# Patient Record
Sex: Female | Born: 1980 | Race: White | Hispanic: No | Marital: Married | Smoking: Current every day smoker
Health system: Southern US, Community
[De-identification: ages and names within clinical notes are randomized; demographics above are authoritative.]

## PROBLEM LIST (undated history)

## (undated) ENCOUNTER — Ambulatory Visit: Admission: EM | Payer: BLUE CROSS/BLUE SHIELD | Source: Home / Self Care

## (undated) DIAGNOSIS — B029 Zoster without complications: Secondary | ICD-10-CM

## (undated) DIAGNOSIS — J45909 Unspecified asthma, uncomplicated: Secondary | ICD-10-CM

## (undated) DIAGNOSIS — F17200 Nicotine dependence, unspecified, uncomplicated: Secondary | ICD-10-CM

## (undated) HISTORY — PX: DILATION AND CURETTAGE OF UTERUS: SHX78

---

## 2005-04-15 ENCOUNTER — Emergency Department: Payer: Self-pay | Admitting: Emergency Medicine

## 2005-11-07 ENCOUNTER — Observation Stay: Payer: Self-pay

## 2007-05-15 ENCOUNTER — Emergency Department: Payer: Self-pay | Admitting: Emergency Medicine

## 2007-09-03 ENCOUNTER — Emergency Department: Payer: Self-pay | Admitting: Emergency Medicine

## 2007-12-04 ENCOUNTER — Emergency Department: Payer: Self-pay | Admitting: Emergency Medicine

## 2008-01-20 ENCOUNTER — Emergency Department: Payer: Self-pay | Admitting: Emergency Medicine

## 2008-04-24 ENCOUNTER — Emergency Department: Payer: Self-pay | Admitting: Emergency Medicine

## 2008-05-26 ENCOUNTER — Emergency Department: Payer: Self-pay | Admitting: Emergency Medicine

## 2008-07-31 ENCOUNTER — Ambulatory Visit: Payer: Self-pay

## 2009-03-20 ENCOUNTER — Emergency Department: Payer: Self-pay | Admitting: Emergency Medicine

## 2009-06-05 ENCOUNTER — Emergency Department: Payer: Self-pay | Admitting: Internal Medicine

## 2009-06-07 ENCOUNTER — Ambulatory Visit: Payer: Self-pay | Admitting: Family Medicine

## 2009-08-10 ENCOUNTER — Emergency Department: Payer: Self-pay | Admitting: Internal Medicine

## 2009-09-20 ENCOUNTER — Emergency Department: Payer: Self-pay | Admitting: Emergency Medicine

## 2010-02-20 ENCOUNTER — Ambulatory Visit: Payer: Self-pay | Admitting: Family Medicine

## 2010-05-11 ENCOUNTER — Emergency Department: Payer: Self-pay | Admitting: Internal Medicine

## 2010-06-21 ENCOUNTER — Emergency Department: Payer: Self-pay | Admitting: Unknown Physician Specialty

## 2010-09-29 ENCOUNTER — Emergency Department: Payer: Self-pay | Admitting: Emergency Medicine

## 2011-04-08 ENCOUNTER — Emergency Department: Payer: Self-pay | Admitting: Internal Medicine

## 2014-07-12 ENCOUNTER — Emergency Department: Payer: Self-pay | Admitting: Emergency Medicine

## 2014-11-19 ENCOUNTER — Emergency Department: Payer: Self-pay | Admitting: Emergency Medicine

## 2015-03-31 ENCOUNTER — Emergency Department
Admit: 2015-03-31 | Disposition: A | Discharge status: Home / Self Care | Payer: Self-pay | Admitting: Physician Assistant

## 2015-04-24 ENCOUNTER — Emergency Department: Payer: Self-pay

## 2015-04-24 ENCOUNTER — Encounter: Payer: Self-pay | Admitting: Urgent Care

## 2015-04-24 ENCOUNTER — Emergency Department
Admission: EM | Admit: 2015-04-24 | Discharge: 2015-04-24 | Disposition: A | Discharge status: Home / Self Care | Payer: Self-pay | Attending: Emergency Medicine | Admitting: Emergency Medicine

## 2015-04-24 DIAGNOSIS — M216X1 Other acquired deformities of right foot: Secondary | ICD-10-CM | POA: Insufficient documentation

## 2015-04-24 DIAGNOSIS — Z880 Allergy status to penicillin: Secondary | ICD-10-CM | POA: Insufficient documentation

## 2015-04-24 DIAGNOSIS — Z720 Tobacco use: Secondary | ICD-10-CM | POA: Insufficient documentation

## 2015-04-24 DIAGNOSIS — M79671 Pain in right foot: Secondary | ICD-10-CM

## 2015-04-24 DIAGNOSIS — M21961 Unspecified acquired deformity of right lower leg: Secondary | ICD-10-CM

## 2015-04-24 HISTORY — DX: Unspecified asthma, uncomplicated: J45.909

## 2015-04-24 MED ORDER — HYDROCODONE-ACETAMINOPHEN 5-325 MG PO TABS: 1.0000 | ORAL_TABLET | ORAL | Status: DC | PRN

## 2015-04-24 MED ORDER — IBUPROFEN 800 MG PO TABS: 800.0000 mg | ORAL_TABLET | Freq: Three times a day (TID) | ORAL | Status: DC | PRN

## 2015-04-24 MED ORDER — HYDROCODONE-ACETAMINOPHEN 5-325 MG PO TABS
ORAL_TABLET | ORAL | Status: AC
  Administered 2015-04-24: 2 via ORAL
  Filled 2015-04-24: qty 2

## 2015-04-24 MED ORDER — HYDROCODONE-ACETAMINOPHEN 5-325 MG PO TABS
2.0000 | ORAL_TABLET | Freq: Once | ORAL | Status: AC
  Administered 2015-04-24: 2 via ORAL
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
## 2015-06-25 ENCOUNTER — Emergency Department
Admission: EM | Admit: 2015-06-25 | Discharge: 2015-06-25 | Disposition: A | Discharge status: Home / Self Care | Payer: No Typology Code available for payment source | Attending: Emergency Medicine | Admitting: Emergency Medicine

## 2015-06-25 ENCOUNTER — Encounter: Payer: Self-pay | Admitting: Emergency Medicine

## 2015-06-25 DIAGNOSIS — Y9389 Activity, other specified: Secondary | ICD-10-CM | POA: Insufficient documentation

## 2015-06-25 DIAGNOSIS — Z880 Allergy status to penicillin: Secondary | ICD-10-CM | POA: Diagnosis not present

## 2015-06-25 DIAGNOSIS — S239XXA Sprain of unspecified parts of thorax, initial encounter: Secondary | ICD-10-CM | POA: Insufficient documentation

## 2015-06-25 DIAGNOSIS — Y92410 Unspecified street and highway as the place of occurrence of the external cause: Secondary | ICD-10-CM | POA: Diagnosis not present

## 2015-06-25 DIAGNOSIS — Y998 Other external cause status: Secondary | ICD-10-CM | POA: Diagnosis not present

## 2015-06-25 DIAGNOSIS — S161XXA Strain of muscle, fascia and tendon at neck level, initial encounter: Secondary | ICD-10-CM | POA: Insufficient documentation

## 2015-06-25 DIAGNOSIS — S199XXA Unspecified injury of neck, initial encounter: Secondary | ICD-10-CM | POA: Diagnosis present

## 2015-06-25 DIAGNOSIS — Z720 Tobacco use: Secondary | ICD-10-CM | POA: Insufficient documentation

## 2015-06-25 MED ORDER — CYCLOBENZAPRINE HCL 10 MG PO TABS
5.0000 mg | ORAL_TABLET | Freq: Once | ORAL | Status: AC
  Administered 2015-06-25: 5 mg via ORAL
  Filled 2015-06-25: qty 1

## 2015-06-25 MED ORDER — CYCLOBENZAPRINE HCL 5 MG PO TABS: 5.0000 mg | ORAL_TABLET | Freq: Three times a day (TID) | ORAL | Status: DC | PRN

## 2015-06-25 MED ORDER — KETOROLAC TROMETHAMINE 10 MG PO TABS: 10.0000 mg | ORAL_TABLET | Freq: Three times a day (TID) | ORAL | Status: DC

## 2015-06-25 MED ORDER — KETOROLAC TROMETHAMINE 10 MG PO TABS
20.0000 mg | ORAL_TABLET | Freq: Once | ORAL | Status: AC
  Administered 2015-06-25: 20 mg via ORAL
  Filled 2015-06-25: qty 2
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
## 2015-07-12 ENCOUNTER — Encounter: Payer: Self-pay | Admitting: Emergency Medicine

## 2015-07-12 ENCOUNTER — Emergency Department
Admission: EM | Admit: 2015-07-12 | Discharge: 2015-07-12 | Disposition: A | Discharge status: Home / Self Care | Payer: Self-pay | Attending: Emergency Medicine | Admitting: Emergency Medicine

## 2015-07-12 DIAGNOSIS — Z880 Allergy status to penicillin: Secondary | ICD-10-CM | POA: Insufficient documentation

## 2015-07-12 DIAGNOSIS — Z720 Tobacco use: Secondary | ICD-10-CM | POA: Insufficient documentation

## 2015-07-12 DIAGNOSIS — K047 Periapical abscess without sinus: Secondary | ICD-10-CM | POA: Insufficient documentation

## 2015-07-12 DIAGNOSIS — K029 Dental caries, unspecified: Secondary | ICD-10-CM | POA: Insufficient documentation

## 2015-07-12 MED ORDER — KETOROLAC TROMETHAMINE 10 MG PO TABS: 10.0000 mg | ORAL_TABLET | Freq: Four times a day (QID) | ORAL | Status: DC | PRN

## 2015-07-12 MED ORDER — CLINDAMYCIN HCL 150 MG PO CAPS: ORAL_CAPSULE | ORAL | Status: DC

## 2015-07-12 NOTE — ED Notes (Signed)
Left side of face swollen and pain to left eye

## 2015-07-12 NOTE — ED Notes (Signed)
C/o left facial swelling

## 2015-07-12 NOTE — ED Provider Notes (Signed)
[facility] Emergency Department Provider Note  ____________________________________________  Time seen:  12:56 PM  I have reviewed the triage vital signs and the nursing notes.   HISTORY  Chief Complaint Facial Swelling   HPI Leah Gates is a 34 y.o. female is here with complaint of left facial swelling. She states that she's had a tooth that has been broken off for some time and believes this is where the infection is coming from. She also had her husband put some dental paste they got over-the-counter at a drugstore into the remaining dental area. She has taken some ibuprofen with minimal relief. She denies any fever or chills. Currently her pain is 7 out of 10.   Past Medical History  Diagnosis Date  . Asthma     There are no active problems to display for this patient.   Past Surgical History  Procedure Laterality Date  . Dilation and curettage of uterus      Current Outpatient Rx  Name  Route  Sig  Dispense  Refill  . clindamycin (CLEOCIN) 150 MG capsule      2 caps every 6 hours until finished   56 capsule   0   . ketorolac (TORADOL) 10 MG tablet   Oral   Take 1 tablet (10 mg total) by mouth every 6 (six) hours as needed.   12 tablet   0     Allergies Penicillins  No family history on file.  Social History History  Substance Use Topics  . Smoking status: Current Every Day Smoker  . Smokeless tobacco: Not on file  . Alcohol Use: No    Review of Systems Constitutional: No fever/chills Eyes: No visual changes. ENT: No sore throat. Dental pain positive Cardiovascular: Denies chest pain. Respiratory: Denies shortness of breath. Gastrointestinal: No abdominal pain.  No nausea, no vomiting.  Musculoskeletal: Negative for back pain. Skin: Negative for rash. Neurological: Negative for headaches  10-point ROS otherwise negative.  ____________________________________________   PHYSICAL EXAM:  VITAL SIGNS: ED Triage  Vitals  Enc Vitals Group     BP 07/12/15 1239 107/62 mmHg     Pulse Rate 07/12/15 1239 101     Resp 07/12/15 1239 18     Temp 07/12/15 1239 97.8 F (36.6 C)     Temp Source 07/12/15 1239 Oral     SpO2 07/12/15 1239 100 %     Weight 07/12/15 1239 125 lb (56.7 kg)     Height 07/12/15 1239 5\' 4"  (1.626 m)     Head Cir --      Peak Flow --      Pain Score 07/12/15 1241 7     Pain Loc --      Pain Edu? --      Excl. in GC? --     Constitutional: Alert and oriented. Well appearing and in no acute distress. Eyes: Conjunctivae are normal. PERRL. EOMI. Head: Atraumatic. Nose: No congestion/rhinnorhea. Mouth/Throat: Left cheek area with edema. Left upper molar with large caries present in the field with a dental paste. Tenderness on palpation of the gum surrounding the tooth. There is no localized abscess seen at this time. Mucous membranes are moist.  Oropharynx non-erythematous. Neck: No stridor.   Hematological/Lymphatic/Immunilogical: No cervical lymphadenopathy. Cardiovascular: Normal rate, regular rhythm. Grossly normal heart sounds.  Good peripheral circulation. Respiratory: Normal respiratory effort.  No retractions. Lungs CTAB. Gastrointestinal: Soft and nontender. No distention. Musculoskeletal: No lower extremity tenderness nor edema.  No joint effusions.  Neurologic:  Normal speech and language. No gross focal neurologic deficits are appreciated. No gait instability. Skin:  Skin is warm, dry and intact. No rash noted. Psychiatric: Mood and affect are normal. Speech and behavior are normal.  ____________________________________________   LABS (all labs ordered are listed, but only abnormal results are displayed)  Labs Reviewed - No data to display  PROCEDURES  Procedure(s) performed: None  Critical Care performed: No  ____________________________________________   INITIAL IMPRESSION / ASSESSMENT AND PLAN / ED COURSE  Pertinent labs & imaging results that were  available during my care of the patient were reviewed by me and considered in my medical decision making (see chart for details).  Patient was started on clindamycin due to her penicillin allergies. She is also given a prescription for Toradol 10 mg 4 times a day as needed for dental pain. She is also given a list of all the dental clinics in the area since she does not have any insurance. She is encouraged to call these clinics to arrange for dental care. ____________________________________________   FINAL CLINICAL IMPRESSION(S) / ED DIAGNOSES  Final diagnoses:  Dental abscess      Tommi Rumps, PA-C 07/12/15 1324  Emily Filbert, MD 07/12/15 [phone]

## 2015-07-12 NOTE — Discharge Instructions (Signed)
Dental Abscess °A dental abscess is a collection of infected fluid (pus) from a bacterial infection in the inner part of the tooth (pulp). It usually occurs at the end of the tooth's root.  °CAUSES  °· Severe tooth decay. °· Trauma to the tooth that allows bacteria to enter into the pulp, such as a broken or chipped tooth. °SYMPTOMS  °· Severe pain in and around the infected tooth. °· Swelling and redness around the abscessed tooth or in the mouth or face. °· Tenderness. °· Pus drainage. °· Bad breath. °· Bitter taste in the mouth. °· Difficulty swallowing. °· Difficulty opening the mouth. °· Nausea. °· Vomiting. °· Chills. °· Swollen neck glands. °DIAGNOSIS  °· A medical and dental history will be taken. °· An examination will be performed by tapping on the abscessed tooth. °· X-rays may be taken of the tooth to identify the abscess. °TREATMENT °The goal of treatment is to eliminate the infection. You may be prescribed antibiotic medicine to stop the infection from spreading. A root canal may be performed to save the tooth. If the tooth cannot be saved, it may be pulled (extracted) and the abscess may be drained.  °HOME CARE INSTRUCTIONS °· Only take over-the-counter or prescription medicines for pain, fever, or discomfort as directed by your caregiver. °· Rinse your mouth (gargle) often with salt water (¼ tsp salt in 8 oz [250 ml] of warm water) to relieve pain or swelling. °· Do not drive after taking pain medicine (narcotics). °· Do not apply heat to the outside of your face. °· Return to your dentist for further treatment as directed. °SEEK MEDICAL CARE IF: °· Your pain is not helped by medicine. °· Your pain is getting worse instead of better. °SEEK IMMEDIATE MEDICAL CARE IF: °· You have a fever or persistent symptoms for more than 2-3 days. °· You have a fever and your symptoms suddenly get worse. °· You have chills or a very bad headache. °· You have problems breathing or swallowing. °· You have trouble  opening your mouth. °· You have swelling in the neck or around the eye. °Document Released: 11/23/2005 Document Revised: 08/17/2012 Document Reviewed: 03/03/2011 °ExitCare® Patient Information ©2015 [employer]. This information is not intended to replace advice given to you by your health care provider. Make sure you discuss any questions you have with your health care provider. ° ° °OPTIONS FOR DENTAL FOLLOW UP CARE ° ° Department of Health and Human Services - Local Safety Net Dental Clinics °http://www.ncdhhs.gov/dph/oralhealth/services/safetynetclinics.htm °  °[facility] (336-562-3123) ° °[facility] [place] (919-933-9087) ° °[facility] (919-663-1744 ext [phone]) ° °[facility] (336-570-6415) ° °[facility] (919-968-2025) °This clinic caters to the indigent population and is on a lottery system. °Location: °[facility], [address], [place] °Clinic Hours: °Wednesdays from 6pm - 9pm, patients seen by a lottery system. °For dates, call or go to www.med.[facility].edu/[facility]/patients/Dental-[facility] °Services: °Cleanings, fillings and simple extractions. °Payment Options: °DENTAL WORK IS FREE OF CHARGE. Bring proof of income or support. °Best way to get seen: °Arrive at 5:15 pm - this is a lottery, NOT first come/first serve, so arriving earlier will not increase your chances of being seen. °  °  °[facility] °919-537-3737 °Select option 1 for emergencies °  °Location: °[facility], [address], [place] °Clinic Hours: °No walk-ins accepted - call the day before to schedule an appointment. °Check in times   are 9:30 am and 1:30 pm. °Services: °Simple extractions, temporary fillings, pulpectomy/pulp debridement, uncomplicated abscess drainage. °Payment Options: °PAYMENT IS DUE AT THE TIME OF SERVICE.  Fee is usually $100-200, additional surgical procedures (e.g. abscess drainage) may  be extra. °Cash, checks, Visa/MasterCard accepted.  Can file Medicaid if patient is covered for dental - patient should call case worker to check. °No discount for [facility] patients. °Best way to get seen: °MUST call the day before and get onto the schedule. Can usually be seen the next 1-2 days. No walk-ins accepted. °  °  °[facility] °[phone] °  °Location: °[facility], [address], [place] °Clinic Hours: °M, W, Th, F 8am or 1:30pm, Tues 9a or 1:30 - first come/first served. °Services: °Simple extractions, temporary fillings, uncomplicated abscess drainage.  You do not need to be an [place] resident. °Payment Options: °PAYMENT IS DUE AT THE TIME OF SERVICE. °Dental insurance, otherwise sliding scale - bring proof of income or support. °Depending on income and treatment needed, cost is usually $50-200. °Best way to get seen: °Arrive early as it is first come/first served. °  °  °[facility] °[phone] °  °Location: °[address] °Clinic Hours: °Mon-Thu 8a-5p °Services: °Most basic dental services including extractions and fillings. °Payment Options: °PAYMENT IS DUE AT THE TIME OF SERVICE. °Sliding scale, up to 50% off - bring proof if income or support. °Medicaid with dental option accepted. °Best way to get seen: °Call to schedule an appointment, can usually be seen within 2 weeks OR they will try to see walk-ins - show up at 8a or 2p (you may have to wait). °  °  °[facility] °919-245-2435 °[place] RESIDENTS ONLY °  °Location: °[facility], [address], [place], [place] [address] °Clinic Hours: By appointment only. °Monday - Thursday 8am-5pm, Friday 8am-12pm °Services: Cleanings, fillings, extractions. °Payment Options: °PAYMENT IS DUE AT THE TIME OF SERVICE. °Cash, Visa or MasterCard. Sliding scale - $30 minimum per service. °Best way to get seen: °Come in to  office, complete packet and make an appointment - need proof of income °or support monies for each household member and proof of [place] residence. °Usually takes about a month to get in. °  °  °[facility] °[phone] °  °Location: °[address] [place] °Clinic Hours: Walk-in Urgent Care Dental Services are offered Monday-Friday mornings only. °The numbers of emergencies accepted daily is limited to the number of °providers available. °Maximum 15 - Mondays, Wednesdays & Thursdays °Maximum 10 - Tuesdays & Fridays °Services: °You do not need to be a [place] resident to be seen for a dental emergency. °Emergencies are defined as pain, swelling, abnormal bleeding, or dental trauma. Walkins will receive x-rays if needed. °NOTE: Dental cleaning is not an emergency. °Payment Options: °PAYMENT IS DUE AT THE TIME OF SERVICE. °Minimum co-pay is $40.00 for uninsured patients. °Minimum co-pay is $3.00 for Medicaid with dental coverage. °Dental Insurance is accepted and must be presented at time of visit. °Medicare does not cover dental. °Forms of payment: Cash, credit card, checks. °Best way to get seen: °If not previously registered with the clinic, walk-in dental registration begins at 7:15 am and is on a first come/first serve basis. °If previously registered with the clinic, call to make an appointment. °  °  °[facility] °[phone] °[place] RESIDENTS ONLY °  °Location: °[address],   [place], [place] °Clinic Hours: °Mon-Thu 10a-2p °Services: Extractions only! °Payment Options: °FREE (donations accepted) - bring proof of income or support °Best way to get seen: °Call and schedule an appointment OR come at 8am on the 1st Monday of every month (except for holidays) when it is first come/first served. °  °  °[facility] °[phone] °  °Location: °[address], [place] °Clinic Hours: °Friday mornings °Services, Payment Options, Best way to get  seen: °Call for info °

## 2015-10-08 ENCOUNTER — Emergency Department
Admission: EM | Admit: 2015-10-08 | Discharge: 2015-10-08 | Disposition: A | Discharge status: Home / Self Care | Payer: Self-pay | Attending: Emergency Medicine | Admitting: Emergency Medicine

## 2015-10-08 ENCOUNTER — Encounter: Payer: Self-pay | Admitting: Emergency Medicine

## 2015-10-08 DIAGNOSIS — Z880 Allergy status to penicillin: Secondary | ICD-10-CM | POA: Insufficient documentation

## 2015-10-08 DIAGNOSIS — Z720 Tobacco use: Secondary | ICD-10-CM | POA: Insufficient documentation

## 2015-10-08 DIAGNOSIS — Z792 Long term (current) use of antibiotics: Secondary | ICD-10-CM | POA: Insufficient documentation

## 2015-10-08 DIAGNOSIS — B029 Zoster without complications: Secondary | ICD-10-CM | POA: Insufficient documentation

## 2015-10-08 MED ORDER — ACYCLOVIR 400 MG PO TABS: 400.0000 mg | ORAL_TABLET | Freq: Every day | ORAL | Status: AC

## 2015-10-08 MED ORDER — HYDROCODONE-ACETAMINOPHEN 5-325 MG PO TABS: 1.0000 | ORAL_TABLET | ORAL | Status: DC | PRN

## 2015-10-08 NOTE — ED Provider Notes (Signed)
[facility] Emergency Department Provider Note  ____________________________________________  Time seen: Approximately 8:46 AM  I have reviewed the triage vital signs and the nursing notes.   HISTORY  Chief Complaint Rash    HPI Leah Gates is a 34 y.o. female since for evaluation of shingles and blisters to the right shoulder. Past medical history the same. Complains of burning pain sensation.   Past Medical History  Diagnosis Date  . Asthma     There are no active problems to display for this patient.   Past Surgical History  Procedure Laterality Date  . Dilation and curettage of uterus      Current Outpatient Rx  Name  Route  Sig  Dispense  Refill  . acyclovir (ZOVIRAX) 400 MG tablet   Oral   Take 1 tablet (400 mg total) by mouth 5 (five) times daily.   50 tablet   0   . clindamycin (CLEOCIN) 150 MG capsule      2 caps every 6 hours until finished   56 capsule   0   . HYDROcodone-acetaminophen (NORCO) 5-325 MG tablet   Oral   Take 1-2 tablets by mouth every 4 (four) hours as needed for moderate pain.   15 tablet   0   . ketorolac (TORADOL) 10 MG tablet   Oral   Take 1 tablet (10 mg total) by mouth every 6 (six) hours as needed.   12 tablet   0     Allergies Penicillins  No family history on file.  Social History Social History  Substance Use Topics  . Smoking status: Current Every Day Smoker  . Smokeless tobacco: None  . Alcohol Use: No    Review of Systems Constitutional: No fever/chills Eyes: No visual changes. ENT: No sore throat. Cardiovascular: Denies chest pain. Respiratory: Denies shortness of breath. Gastrointestinal: No abdominal pain.  No nausea, no vomiting.  No diarrhea.  No constipation. Genitourinary: Negative for dysuria. Musculoskeletal: Negative for back pain. Skin: Positive for blistering rash right outer shoulder. Neurological: Negative for headaches, focal weakness or  numbness.  10-point ROS otherwise negative.  ____________________________________________   PHYSICAL EXAM:  VITAL SIGNS: ED Triage Vitals  Enc Vitals Group     BP 10/08/15 0827 113/67 mmHg     Pulse Rate 10/08/15 0827 73     Resp 10/08/15 0827 16     Temp 10/08/15 0827 97.6 F (36.4 C)     Temp Source 10/08/15 0827 Oral     SpO2 10/08/15 0827 100 %     Weight 10/08/15 0827 119 lb (53.978 kg)     Height 10/08/15 0827 5\' 2"  (1.575 m)     Head Cir --      Peak Flow --      Pain Score 10/08/15 0828 8     Pain Loc --      Pain Edu? --      Excl. in GC? --     Constitutional: Alert and oriented. Well appearing and in no acute distress. Cardiovascular: Normal rate, regular rhythm. Grossly normal heart sounds.  Good peripheral circulation. Respiratory: Normal respiratory effort.  No retractions. Lungs CTAB. Musculoskeletal: No lower extremity tenderness nor edema.  No joint effusions. Neurologic:  Normal speech and language. No gross focal neurologic deficits are appreciated. No gait instability. Skin:  Skin is warm, dry and intact. Positive rash with blisters intact. Right outer shoulder 2 cm nummular lesion area with approximately 6 blisters/vesicles Psychiatric: Mood and affect are  normal. Speech and behavior are normal.  ____________________________________________   LABS (all labs ordered are listed, but only abnormal results are displayed)  Labs Reviewed - No data to display ____________________________________________    PROCEDURES  Procedure(s) performed: None  Critical Care performed: No  ____________________________________________   INITIAL IMPRESSION / ASSESSMENT AND PLAN / ED COURSE  Pertinent labs & imaging results that were available during my care of the patient were reviewed by me and considered in my medical decision making (see chart for details).  Acute exacerbation of shingles. Rx given for acyclovir 400 mg 5 times daily, Vicodin as needed for  pain. ____________________________________________   FINAL CLINICAL IMPRESSION(S) / ED DIAGNOSES  Final diagnoses:  Shingles rash      Evangeline Dakin, PA-C 10/08/15 1610  Rockne Menghini, MD 10/08/15 1455

## 2015-10-08 NOTE — Discharge Instructions (Signed)

## 2015-10-08 NOTE — ED Notes (Signed)
Patient to ER with blistering rash to right shoulder. Patient has h/o shingles and states it looks and feels the same.

## 2015-10-08 NOTE — ED Notes (Signed)
Has small blisters on right shoulder

## 2015-10-26 ENCOUNTER — Encounter: Payer: Self-pay | Admitting: Emergency Medicine

## 2015-10-26 ENCOUNTER — Emergency Department
Admission: EM | Admit: 2015-10-26 | Discharge: 2015-10-26 | Disposition: A | Discharge status: Home / Self Care | Payer: Self-pay | Attending: Emergency Medicine | Admitting: Emergency Medicine

## 2015-10-26 ENCOUNTER — Emergency Department: Payer: Self-pay

## 2015-10-26 DIAGNOSIS — Y9289 Other specified places as the place of occurrence of the external cause: Secondary | ICD-10-CM | POA: Insufficient documentation

## 2015-10-26 DIAGNOSIS — S90121A Contusion of right lesser toe(s) without damage to nail, initial encounter: Secondary | ICD-10-CM

## 2015-10-26 DIAGNOSIS — F17210 Nicotine dependence, cigarettes, uncomplicated: Secondary | ICD-10-CM | POA: Insufficient documentation

## 2015-10-26 DIAGNOSIS — W500XXA Accidental hit or strike by another person, initial encounter: Secondary | ICD-10-CM | POA: Insufficient documentation

## 2015-10-26 DIAGNOSIS — S90211A Contusion of right great toe with damage to nail, initial encounter: Secondary | ICD-10-CM | POA: Insufficient documentation

## 2015-10-26 DIAGNOSIS — Y9389 Activity, other specified: Secondary | ICD-10-CM | POA: Insufficient documentation

## 2015-10-26 DIAGNOSIS — Z880 Allergy status to penicillin: Secondary | ICD-10-CM | POA: Insufficient documentation

## 2015-10-26 DIAGNOSIS — Y998 Other external cause status: Secondary | ICD-10-CM | POA: Insufficient documentation

## 2015-10-26 DIAGNOSIS — Z792 Long term (current) use of antibiotics: Secondary | ICD-10-CM | POA: Insufficient documentation

## 2015-10-26 MED ORDER — OXYCODONE-ACETAMINOPHEN 5-325 MG PO TABS: 1.0000 | ORAL_TABLET | ORAL | Status: DC | PRN

## 2015-10-26 NOTE — ED Provider Notes (Signed)
[facility] Emergency Department Provider Note  ____________________________________________  Time seen: Approximately 1:39 PM  I have reviewed the triage vital signs and the nursing notes.   HISTORY  Chief Complaint Toe Pain    HPI Carolann LittlerMonica M Puerta is a 34 y.o. female presents with right great toenail injury. Patient states that last night the toe nail was bent back and then she reinjured his toenail and this morning. Complains of increased pain concerned about fracture secondary to boyfriend reports stepping on her toe. The pain is 10 over 10 at this time.   Past Medical History  Diagnosis Date  . Asthma     There are no active problems to display for this patient.   Past Surgical History  Procedure Laterality Date  . Dilation and curettage of uterus      Current Outpatient Rx  Name  Route  Sig  Dispense  Refill  . clindamycin (CLEOCIN) 150 MG capsule      2 caps every 6 hours until finished   56 capsule   0   . HYDROcodone-acetaminophen (NORCO) 5-325 MG tablet   Oral   Take 1-2 tablets by mouth every 4 (four) hours as needed for moderate pain.   15 tablet   0   . ketorolac (TORADOL) 10 MG tablet   Oral   Take 1 tablet (10 mg total) by mouth every 6 (six) hours as needed.   12 tablet   0   . oxyCODONE-acetaminophen (ROXICET) 5-325 MG tablet   Oral   Take 1-2 tablets by mouth every 4 (four) hours as needed for severe pain.   15 tablet   0     Allergies Penicillins  No family history on file.  Social History Social History  Substance Use Topics  . Smoking status: Current Every Day Smoker -- 1.00 packs/day    Types: Cigarettes  . Smokeless tobacco: None  . Alcohol Use: No    Review of Systems Constitutional: No fever/chills Eyes: No visual changes. ENT: No sore throat. Cardiovascular: Denies chest pain. Respiratory: Denies shortness of breath. Gastrointestinal: No abdominal pain.  No nausea, no vomiting.  No  diarrhea.  No constipation. Genitourinary: Negative for dysuria. Musculoskeletal: Positive for right great toenail pain. Skin: Negative for rash. Neurological: Negative for headaches, focal weakness or numbness.  10-point ROS otherwise negative.  ____________________________________________   PHYSICAL EXAM:  VITAL SIGNS: ED Triage Vitals  Enc Vitals Group     BP 10/26/15 1232 106/55 mmHg     Pulse Rate 10/26/15 1232 100     Resp 10/26/15 1232 20     Temp 10/26/15 1232 98 F (36.7 C)     Temp Source 10/26/15 1232 Oral     SpO2 10/26/15 1232 97 %     Weight 10/26/15 1232 119 lb (53.978 kg)     Height 10/26/15 1232 5\' 4"  (1.626 m)     Head Cir --      Peak Flow --      Pain Score 10/26/15 1234 9     Pain Loc --      Pain Edu? --      Excl. in GC? --     Constitutional: Alert and oriented. Well appearing and in no acute distress. Eyes: Conjunctivae are normal. PERRL. EOMI. Head: Atraumatic. Nose: No congestion/rhinnorhea. Mouth/Throat: Mucous membranes are moist.  Oropharynx non-erythematous. Neck: No stridor.   Cardiovascular: Normal rate, regular rhythm. Grossly normal heart sounds.  Good peripheral circulation. Respiratory: Normal respiratory effort.  No retractions. Lungs CTAB. Musculoskeletal: No lower extremity tenderness nor edema.  No joint effusions. Neurologic:  Normal speech and language. No gross focal neurologic deficits are appreciated. No gait instability. Skin:  Skin is warm, dry and intact. No rash noted. Psychiatric: Mood and affect are normal. Speech and behavior are normal.  ____________________________________________   LABS (all labs ordered are listed, but only abnormal results are displayed)  Labs Reviewed - No data to display ____________________________________________   RADIOLOGY  No acute fracture dislocation. ____________________________________________   PROCEDURES  Procedure(s) performed: None  Critical Care performed:  No  ____________________________________________   INITIAL IMPRESSION / ASSESSMENT AND PLAN / ED COURSE  Pertinent labs & imaging results that were available during my care of the patient were reviewed by me and considered in my medical decision making (see chart for details).  Acute right no great toe contusion. Rx given for Percocet 5/325 as needed for pain patient follow-up PCP or return here with any worsening symptomology. ____________________________________________   FINAL CLINICAL IMPRESSION(S) / ED DIAGNOSES  Final diagnoses:  Contusion of toe of right foot, initial encounter      Evangeline Dakin, PA-C 10/26/15 1507  Myrna Blazer, MD 10/26/15 [phone]

## 2015-10-26 NOTE — Discharge Instructions (Signed)
Subungual Hematoma A subungual hematoma is a pocket of blood that collects under the fingernail or toenail. The pressure created by the blood under the nail can cause pain. CAUSES  A subungual hematoma occurs when an injury to the finger or toe causes a blood vessel beneath the nail to break. The injury can occur from a direct blow such as slamming a finger in a door. It can also occur from a repeated injury such as pressure on the foot in a shoe while running. A subungual hematoma is sometimes called runner's toe or tennis toe. SYMPTOMS   Blue or dark blue skin under the nail.  Pain or throbbing in the injured area. DIAGNOSIS  Your caregiver can determine whether you have a subungual hematoma based on your history and a physical exam. If your caregiver thinks you might have a broken (fractured) bone, X-rays may be taken. TREATMENT  Hematomas usually go away on their own over time. Your caregiver may make a hole in the nail to drain the blood. Draining the blood is painless and usually provides significant relief from pain and throbbing. The nail usually grows back normally after this procedure. In some cases, the nail may need to be removed. This is done if there is a cut under the nail that requires stitches (sutures). HOME CARE INSTRUCTIONS   Put ice on the injured area.  Put ice in a plastic bag.  Place a towel between your skin and the bag.  Leave the ice on for 15-20 minutes, 03-04 times a day for the first 1 to 2 days.  Elevate the injured area to help decrease pain and swelling.  If you were given a bandage, wear it for as long as directed by your caregiver.  If part of your nail falls off, trim the remaining nail gently. This prevents the nail from catching on something and causing further injury.  Only take over-the-counter or prescription medicines for pain, discomfort, or fever as directed by your caregiver. SEEK IMMEDIATE MEDICAL CARE IF:   You have redness or swelling  around the nail.  You have yellowish-white fluid (pus) coming from the nail.  Your pain is not controlled with medicine.  You have a fever. MAKE SURE YOU:  Understand these instructions.  Will watch your condition.  Will get help right away if you are not doing well or get worse.   This information is not intended to replace advice given to you by your health care provider. Make sure you discuss any questions you have with your health care provider.   Document Released: 11/20/2000 Document Revised: 02/15/2012 Document Reviewed: 04/10/2015 Elsevier Interactive Patient Education 2016 [employer]  Contusion A contusion is a deep bruise. Contusions are the result of a blunt injury to tissues and muscle fibers under the skin. The injury causes bleeding under the skin. The skin overlying the contusion may turn blue, purple, or yellow. Minor injuries will give you a painless contusion, but more severe contusions may stay painful and swollen for a few weeks.  CAUSES  This condition is usually caused by a blow, trauma, or direct force to an area of the body. SYMPTOMS  Symptoms of this condition include:  Swelling of the injured area.  Pain and tenderness in the injured area.  Discoloration. The area may have redness and then turn blue, purple, or yellow. DIAGNOSIS  This condition is diagnosed based on a physical exam and medical history. An X-ray, CT scan, or MRI may be needed to determine  if there are any associated injuries, such as broken bones (fractures). °TREATMENT  °Specific treatment for this condition depends on what area of the body was injured. In general, the best treatment for a contusion is resting, icing, applying pressure to (compression), and elevating the injured area. This is often called the RICE strategy. Over-the-counter anti-inflammatory medicines may also be recommended for pain control.  °HOME CARE INSTRUCTIONS  °· Rest the injured area. °· If directed, apply ice  to the injured area: °¨ Put ice in a plastic bag. °¨ Place a towel between your skin and the bag. °¨ Leave the ice on for 20 minutes, 2-3 times per day. °· If directed, apply light compression to the injured area using an elastic bandage. Make sure the bandage is not wrapped too tightly. Remove and reapply the bandage as directed by your health care provider. °· If possible, raise (elevate) the injured area above the level of your heart while you are sitting or lying down. °· Take over-the-counter and prescription medicines only as told by your health care provider. °SEEK MEDICAL CARE IF: °· Your symptoms do not improve after several days of treatment. °· Your symptoms get worse. °· You have difficulty moving the injured area. °SEEK IMMEDIATE MEDICAL CARE IF:  °· You have severe pain. °· You have numbness in a hand or foot. °· Your hand or foot turns pale or cold. °  °This information is not intended to replace advice given to you by your health care provider. Make sure you discuss any questions you have with your health care provider. °  °Document Released: 09/02/2005 Document Revised: 08/14/2015 Document Reviewed: 04/10/2015 °Elsevier Interactive Patient Education ©2016 Elsevier Inc. ° °

## 2015-10-26 NOTE — ED Notes (Signed)
Bent toenail back at initial injury

## 2016-01-11 ENCOUNTER — Encounter: Payer: Self-pay | Admitting: Emergency Medicine

## 2016-01-11 ENCOUNTER — Emergency Department
Admission: EM | Admit: 2016-01-11 | Discharge: 2016-01-11 | Disposition: A | Discharge status: Home / Self Care | Payer: No Typology Code available for payment source | Attending: Emergency Medicine | Admitting: Emergency Medicine

## 2016-01-11 ENCOUNTER — Emergency Department: Payer: No Typology Code available for payment source

## 2016-01-11 DIAGNOSIS — T148 Other injury of unspecified body region: Secondary | ICD-10-CM | POA: Insufficient documentation

## 2016-01-11 DIAGNOSIS — S80811A Abrasion, right lower leg, initial encounter: Secondary | ICD-10-CM | POA: Diagnosis not present

## 2016-01-11 DIAGNOSIS — Y998 Other external cause status: Secondary | ICD-10-CM | POA: Insufficient documentation

## 2016-01-11 DIAGNOSIS — Y92410 Unspecified street and highway as the place of occurrence of the external cause: Secondary | ICD-10-CM | POA: Insufficient documentation

## 2016-01-11 DIAGNOSIS — S80812A Abrasion, left lower leg, initial encounter: Secondary | ICD-10-CM | POA: Diagnosis not present

## 2016-01-11 DIAGNOSIS — T148XXA Other injury of unspecified body region, initial encounter: Secondary | ICD-10-CM

## 2016-01-11 DIAGNOSIS — S8992XA Unspecified injury of left lower leg, initial encounter: Secondary | ICD-10-CM | POA: Diagnosis present

## 2016-01-11 DIAGNOSIS — Z880 Allergy status to penicillin: Secondary | ICD-10-CM | POA: Insufficient documentation

## 2016-01-11 DIAGNOSIS — Y9389 Activity, other specified: Secondary | ICD-10-CM | POA: Diagnosis not present

## 2016-01-11 DIAGNOSIS — Z792 Long term (current) use of antibiotics: Secondary | ICD-10-CM | POA: Insufficient documentation

## 2016-01-11 DIAGNOSIS — F17210 Nicotine dependence, cigarettes, uncomplicated: Secondary | ICD-10-CM | POA: Insufficient documentation

## 2016-01-11 MED ORDER — IBUPROFEN 800 MG PO TABS: 800.0000 mg | ORAL_TABLET | Freq: Three times a day (TID) | ORAL | Status: DC | PRN

## 2016-01-11 MED ORDER — IBUPROFEN 800 MG PO TABS
800.0000 mg | ORAL_TABLET | Freq: Once | ORAL | Status: AC
  Administered 2016-01-11: 800 mg via ORAL

## 2016-01-11 MED ORDER — BACITRACIN ZINC 500 UNIT/GM EX OINT
TOPICAL_OINTMENT | Freq: Once | CUTANEOUS | Status: AC
  Administered 2016-01-11: 1 via TOPICAL

## 2016-01-11 MED ORDER — HYDROCODONE-ACETAMINOPHEN 5-325 MG PO TABS: 1.0000 | ORAL_TABLET | ORAL | Status: DC | PRN

## 2016-01-11 MED ORDER — OXYCODONE-ACETAMINOPHEN 5-325 MG PO TABS
1.0000 | ORAL_TABLET | Freq: Once | ORAL | Status: AC
  Administered 2016-01-11: 1 via ORAL
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
## 2016-01-11 NOTE — Discharge Instructions (Signed)
Continue pain medication as needed. Cleanse wounds daily and watch for signs of infection. He may use topical over-the-counter antibiotic ointment as needed. Return to emergency room for any worsening symptoms. Follow-up with your physician as needed.

## 2016-01-19 ENCOUNTER — Emergency Department
Admission: EM | Admit: 2016-01-19 | Discharge: 2016-01-19 | Disposition: A | Discharge status: Home / Self Care | Payer: No Typology Code available for payment source | Attending: Emergency Medicine | Admitting: Emergency Medicine

## 2016-01-19 DIAGNOSIS — Y9389 Activity, other specified: Secondary | ICD-10-CM | POA: Insufficient documentation

## 2016-01-19 DIAGNOSIS — S8992XA Unspecified injury of left lower leg, initial encounter: Secondary | ICD-10-CM | POA: Diagnosis present

## 2016-01-19 DIAGNOSIS — Y92410 Unspecified street and highway as the place of occurrence of the external cause: Secondary | ICD-10-CM | POA: Diagnosis not present

## 2016-01-19 DIAGNOSIS — F17210 Nicotine dependence, cigarettes, uncomplicated: Secondary | ICD-10-CM | POA: Insufficient documentation

## 2016-01-19 DIAGNOSIS — M79604 Pain in right leg: Secondary | ICD-10-CM

## 2016-01-19 DIAGNOSIS — Y998 Other external cause status: Secondary | ICD-10-CM | POA: Insufficient documentation

## 2016-01-19 DIAGNOSIS — S80812A Abrasion, left lower leg, initial encounter: Secondary | ICD-10-CM | POA: Diagnosis not present

## 2016-01-19 DIAGNOSIS — S80811A Abrasion, right lower leg, initial encounter: Secondary | ICD-10-CM | POA: Diagnosis not present

## 2016-01-19 DIAGNOSIS — M79605 Pain in left leg: Secondary | ICD-10-CM

## 2016-01-19 DIAGNOSIS — Z792 Long term (current) use of antibiotics: Secondary | ICD-10-CM | POA: Insufficient documentation

## 2016-01-19 DIAGNOSIS — Z880 Allergy status to penicillin: Secondary | ICD-10-CM | POA: Insufficient documentation

## 2016-01-19 LAB — CBC WITH DIFFERENTIAL/PLATELET
Basophils Absolute: 0 10*3/uL (ref 0–0.1)
Basophils Relative: 1 %
Eosinophils Absolute: 0.1 10*3/uL (ref 0–0.7)
Eosinophils Relative: 1 %
HCT: 35.6 % (ref 35.0–47.0)
HEMOGLOBIN: 12.2 g/dL (ref 12.0–16.0)
LYMPHS PCT: 33 %
Lymphs Abs: 2.2 10*3/uL (ref 1.0–3.6)
MCH: 32.4 pg (ref 26.0–34.0)
MCHC: 34.2 g/dL (ref 32.0–36.0)
MCV: 94.8 fL (ref 80.0–100.0)
Monocytes Absolute: 0.5 10*3/uL (ref 0.2–0.9)
Monocytes Relative: 7 %
Neutro Abs: 3.9 10*3/uL (ref 1.4–6.5)
Neutrophils Relative %: 58 %
Platelets: 321 10*3/uL (ref 150–440)
RBC: 3.75 MIL/uL — AB (ref 3.80–5.20)
RDW: 13.1 % (ref 11.5–14.5)
WBC: 6.8 10*3/uL (ref 3.6–11.0)

## 2016-01-19 MED ORDER — MUPIROCIN 2 % EX OINT: TOPICAL_OINTMENT | CUTANEOUS | Status: AC

## 2016-01-19 MED ORDER — ETODOLAC 400 MG PO TABS: 400.0000 mg | ORAL_TABLET | Freq: Two times a day (BID) | ORAL | Status: DC

## 2016-01-19 NOTE — Discharge Instructions (Signed)
Abrasion An abrasion is a cut or scrape on the surface of your skin. An abrasion does not go through all of the layers of your skin. It is important to take good care of your abrasion to prevent infection. HOME CARE Medicines  Take or apply medicines only as told by your doctor.  If you were prescribed an antibiotic ointment, finish all of it even if you start to feel better. Wound Care  Clean the wound with mild soap and water 2-3 times per day or as told by your doctor. Pat your wound dry with a clean towel. Do not rub it.  There are many ways to close and cover a wound. Follow instructions from your doctor about:  How to take care of your wound.  When and how you should change your bandage (dressing).  When and how you should take off your dressing.  Check your wound every day for signs of infection. Watch for:  Redness, swelling, or pain.  Fluid, blood, or pus. General Instructions  Keep the dressing dry as told by your doctor. Do not take baths, swim, use a hot tub, or do anything that would put your wound underwater until your doctor says it is okay.  If there is swelling, raise (elevate) the injured area above the level of your heart while you are sitting or lying down.  Keep all follow-up visits as told by your doctor. This is important. GET HELP IF:  You were given a tetanus shot and you have any of these where the needle went in:  Swelling.  Very bad pain.  Redness.  Bleeding.  Medicine does not help your pain.  You have any of these at the site of the wound:  More redness.  More swelling.  More pain. GET HELP RIGHT AWAY IF:  You have a red streak going away from your wound.  You have a fever.  You have fluid, blood, or pus coming from your wound.  There is a bad smell coming from your wound.   This information is not intended to replace advice given to you by your health care provider. Make sure you discuss any questions you have with your  health care provider.   Document Released: 05/11/2008 Document Revised: 04/09/2015 Document Reviewed: 11/21/2014 Elsevier Interactive Patient Education 2016 [employer].   Follow-up with [facility] if any continued problems. Discontinue taking ibuprofen and begin taking etodolac for inflammation and pain. Begin using Bactroban to your abrasions. Ice and elevate as needed for swelling and pain.
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
## 2016-02-20 ENCOUNTER — Emergency Department
Admission: EM | Admit: 2016-02-20 | Discharge: 2016-02-20 | Disposition: A | Discharge status: Home / Self Care | Payer: No Typology Code available for payment source | Attending: Emergency Medicine | Admitting: Emergency Medicine

## 2016-02-20 ENCOUNTER — Encounter: Payer: Self-pay | Admitting: *Deleted

## 2016-02-20 DIAGNOSIS — Y998 Other external cause status: Secondary | ICD-10-CM | POA: Insufficient documentation

## 2016-02-20 DIAGNOSIS — S3992XA Unspecified injury of lower back, initial encounter: Secondary | ICD-10-CM | POA: Diagnosis present

## 2016-02-20 DIAGNOSIS — Y9389 Activity, other specified: Secondary | ICD-10-CM | POA: Insufficient documentation

## 2016-02-20 DIAGNOSIS — Z792 Long term (current) use of antibiotics: Secondary | ICD-10-CM | POA: Diagnosis not present

## 2016-02-20 DIAGNOSIS — S39012A Strain of muscle, fascia and tendon of lower back, initial encounter: Secondary | ICD-10-CM | POA: Diagnosis not present

## 2016-02-20 DIAGNOSIS — Y92410 Unspecified street and highway as the place of occurrence of the external cause: Secondary | ICD-10-CM | POA: Insufficient documentation

## 2016-02-20 DIAGNOSIS — F17210 Nicotine dependence, cigarettes, uncomplicated: Secondary | ICD-10-CM | POA: Insufficient documentation

## 2016-02-20 DIAGNOSIS — Z880 Allergy status to penicillin: Secondary | ICD-10-CM | POA: Diagnosis not present

## 2016-02-20 MED ORDER — IBUPROFEN 600 MG PO TABS: 600.0000 mg | ORAL_TABLET | Freq: Three times a day (TID) | ORAL | Status: DC | PRN

## 2016-02-20 MED ORDER — HYDROCODONE-ACETAMINOPHEN 5-325 MG PO TABS: 1.0000 | ORAL_TABLET | ORAL | Status: DC | PRN

## 2016-02-20 MED ORDER — DIAZEPAM 2 MG PO TABS: 2.0000 mg | ORAL_TABLET | Freq: Three times a day (TID) | ORAL | Status: DC | PRN

## 2016-02-20 MED ORDER — IBUPROFEN 600 MG PO TABS
600.0000 mg | ORAL_TABLET | Freq: Once | ORAL | Status: AC
  Administered 2016-02-20: 600 mg via ORAL
  Filled 2016-02-20: qty 1
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
## 2016-03-29 ENCOUNTER — Encounter: Payer: Self-pay | Admitting: *Deleted

## 2016-03-29 ENCOUNTER — Emergency Department
Admission: EM | Admit: 2016-03-29 | Discharge: 2016-03-29 | Disposition: A | Discharge status: Home / Self Care | Payer: No Typology Code available for payment source | Attending: Emergency Medicine | Admitting: Emergency Medicine

## 2016-03-29 DIAGNOSIS — H18821 Corneal disorder due to contact lens, right eye: Secondary | ICD-10-CM | POA: Insufficient documentation

## 2016-03-29 DIAGNOSIS — Z791 Long term (current) use of non-steroidal anti-inflammatories (NSAID): Secondary | ICD-10-CM | POA: Insufficient documentation

## 2016-03-29 DIAGNOSIS — Y929 Unspecified place or not applicable: Secondary | ICD-10-CM | POA: Insufficient documentation

## 2016-03-29 DIAGNOSIS — Y999 Unspecified external cause status: Secondary | ICD-10-CM | POA: Insufficient documentation

## 2016-03-29 DIAGNOSIS — Z79899 Other long term (current) drug therapy: Secondary | ICD-10-CM | POA: Insufficient documentation

## 2016-03-29 DIAGNOSIS — S0501XA Injury of conjunctiva and corneal abrasion without foreign body, right eye, initial encounter: Secondary | ICD-10-CM | POA: Insufficient documentation

## 2016-03-29 DIAGNOSIS — Z880 Allergy status to penicillin: Secondary | ICD-10-CM | POA: Insufficient documentation

## 2016-03-29 DIAGNOSIS — J45909 Unspecified asthma, uncomplicated: Secondary | ICD-10-CM | POA: Insufficient documentation

## 2016-03-29 DIAGNOSIS — X58XXXA Exposure to other specified factors, initial encounter: Secondary | ICD-10-CM | POA: Insufficient documentation

## 2016-03-29 DIAGNOSIS — Y939 Activity, unspecified: Secondary | ICD-10-CM | POA: Insufficient documentation

## 2016-03-29 MED ORDER — FLUORESCEIN SODIUM 1 MG OP STRP
1.0000 | ORAL_STRIP | Freq: Once | OPHTHALMIC | Status: AC
  Administered 2016-03-29: 1 via OPHTHALMIC
  Filled 2016-03-29: qty 1

## 2016-03-29 MED ORDER — TETRACAINE HCL 0.5 % OP SOLN
2.0000 [drp] | Freq: Once | OPHTHALMIC | Status: AC
  Administered 2016-03-29: 2 [drp] via OPHTHALMIC
  Filled 2016-03-29: qty 2

## 2016-03-29 MED ORDER — ERYTHROMYCIN 5 MG/GM OP OINT: 1.0000 "application " | TOPICAL_OINTMENT | Freq: Four times a day (QID) | OPHTHALMIC | Status: DC

## 2016-03-29 NOTE — Discharge Instructions (Signed)
DO NOT WEAR YOUR CONTACT LENSES FOR 1 WEEK.   Please follow up with [facility] tomorrow.   Corneal Abrasion    The cornea is the clear covering at the front and center of the eye. When looking at the colored portion of the eye (iris), you are looking through the cornea. This very thin tissue is made up of many layers. The surface layer is a single layer of cells (corneal epithelium) and is one of the most sensitive tissues in the body. If a scratch or injury causes the corneal epithelium to come off, it is called a corneal abrasion. If the injury extends to the tissues below the epithelium, the condition is called a corneal ulcer.  CAUSES  Scratches.  Trauma.  Foreign body in the eye. Some people have recurrences of abrasions in the area of the original injury even after it has healed (recurrent erosion syndrome). Recurrent erosion syndrome generally improves and goes away with time.  SYMPTOMS  Eye pain.  Difficulty or inability to keep the injured eye open.  The eye becomes very sensitive to light.  Recurrent erosions tend to happen suddenly, first thing in the morning, usually after waking up and opening the eye. DIAGNOSIS  Your health care provider can diagnose a corneal abrasion during an eye exam. Dye is usually placed in the eye using a drop or a small paper strip moistened by your tears. When the eye is examined with a special light, the abrasion shows up clearly because of the dye.  TREATMENT  Small abrasions may be treated with antibiotic drops or ointment alone.  A pressure patch may be put over the eye. If this is done, follow your doctor's instructions for when to remove the patch. Do not drive or use machines while the eye patch is on. Judging distances is hard to do with a patch on. If the abrasion becomes infected and spreads to the deeper tissues of the cornea, a corneal ulcer can result. This is serious because it can cause corneal scarring. Corneal scars interfere  with light passing through the cornea and cause a loss of vision in the involved eye.  HOME CARE INSTRUCTIONS  Use medicine or ointment as directed. Only take over-the-counter or prescription medicines for pain, discomfort, or fever as directed by your health care provider.  Do not drive or operate machinery if your eye is patched. Your ability to judge distances is impaired.  If your health care provider has given you a follow-up appointment, it is very important to keep that appointment. Not keeping the appointment could result in a severe eye infection or permanent loss of vision. If there is any problem keeping the appointment, let your health care provider know. SEEK MEDICAL CARE IF:  You have pain, light sensitivity, and a scratchy feeling in one eye or both eyes.  Your pressure patch keeps loosening up, and you can blink your eye under the patch after treatment.  Any kind of discharge develops from the eye after treatment or if the lids stick together in the morning.  You have the same symptoms in the morning as you did with the original abrasion days, weeks, or months after the abrasion healed. This information is not intended to replace advice given to you by your health care provider. Make sure you discuss any questions you have with your health care provider.  Document Released: 11/20/2000 Document Revised: 08/14/2015 Document Reviewed: 07/31/2013  Elsevier Interactive Patient Education [employer]

## 2016-03-29 NOTE — ED Notes (Signed)
Patient had irritation and redness to right eye yesterday and woke up today with drainage, tenderness and swelling.

## 2016-03-29 NOTE — ED Provider Notes (Signed)
[facility] Emergency Department Provider Note  ____________________________________________  Time seen: Approximately 2:53 PM  I have reviewed the triage vital signs and the nursing notes.   HISTORY  Chief Complaint Conjunctivitis    HPI Leah Gates is a 35 y.o. female , NAD, presents emergency Department with 2 day history of right eye irritation and drainage. States she woke up yesterday felt like she had a foreign body in the right eye. Took her contact out to clean it and put it back in. Woke today with increasing pain to the right eye along with redness and significant clear drainage. The eye has been tearing throughout the day. Denies any injury or trauma to the eye. Has not noted any sick or purulent discharge from the right eye. Has had some right-sided nasal drainage since the onset of eye pain. Denies any nasal congestion, sneezing, sinus pressure, ear pain, sore throat. Does note blurred vision in the right eye since the onset of irritation.   Past Medical History  Diagnosis Date  . Asthma     There are no active problems to display for this patient.   Past Surgical History  Procedure Laterality Date  . Dilation and curettage of uterus      Current Outpatient Rx  Name  Route  Sig  Dispense  Refill  . diazepam (VALIUM) 2 MG tablet   Oral   Take 1 tablet (2 mg total) by mouth every 8 (eight) hours as needed for muscle spasms.   9 tablet   0   . erythromycin ophthalmic ointment   Right Eye   Place 1 application into the right eye 4 (four) times daily. Apply 1cm ribbon to lower eyelid 4 times daily.   3.5 g   0   . HYDROcodone-acetaminophen (NORCO/VICODIN) 5-325 MG tablet   Oral   Take 1 tablet by mouth every 4 (four) hours as needed for moderate pain.   20 tablet   0   . ibuprofen (ADVIL,MOTRIN) 600 MG tablet   Oral   Take 1 tablet (600 mg total) by mouth every 8 (eight) hours as needed.   30 tablet   0   . mupirocin  ointment (BACTROBAN) 2 %      Apply to affected area 3 times daily   22 g   0     Allergies Penicillins  No family history on file.  Social History Social History  Substance Use Topics  . Smoking status: Current Every Day Smoker -- 1.00 packs/day    Types: Cigarettes  . Smokeless tobacco: None  . Alcohol Use: No     Review of Systems  Constitutional: No fever/chills vomited fatigue Eyes: Positive blurred vision right eye. Positive redness, pain, tearing of the right eye. No swelling, redness about the eyelids ENT: Positive for runny nose. No sore throat, ear pain, sinus pressure, sneezing, nasal congestion. Cardiovascular: No chest pain. Respiratory: No cough. No shortness of breath.  Skin: Negative for rash. Neurological: Negative for headaches, focal weakness or numbness. 10-point ROS otherwise negative.  ____________________________________________   PHYSICAL EXAM:  VITAL SIGNS: ED Triage Vitals  Enc Vitals Group     BP 03/29/16 1428 103/61 mmHg     Pulse Rate 03/29/16 1428 73     Resp 03/29/16 1428 18     Temp 03/29/16 1428 98.4 F (36.9 C)     Temp Source 03/29/16 1428 Oral     SpO2 03/29/16 1428 100 %     Weight 03/29/16  1428 125 lb (56.7 kg)     Height 03/29/16 1428  (1.626 m)     Head Cir --      Peak Flow --      Pain Score 03/29/16 1429 9     Pain Loc --      Pain Edu? --      Excl. in GC? --      Constitutional: Alert and oriented. Well appearing and in no acute distress. Eyes: Fluorescein uptake noted at the 7:00 portion of the conjunctiva over the iris measuring approximately 2 mm annular. Conjunctivae about the right eye with moderate erythema. Profuse clear drainage from the right eye is noted. Left eye without erythema, discharge, swelling. Bilateral upper and lower eyelids without erythema, swelling, skin sores. PERRL. EOMI without pain.  Head: Atraumatic. Neck: Supple with full range of  motion. Hematological/Lymphatic/Immunilogical: No cervical lymphadenopathy. Respiratory: Normal respiratory effort without tachypnea or retractions.  Neurologic:  Normal speech and language. No gross focal neurologic deficits are appreciated.  Skin:  Skin is warm, dry and intact. No rash noted. Psychiatric: Mood and affect are normal. Speech and behavior are normal. Patient exhibits appropriate insight and judgement.   ____________________________________________   LABS  None ____________________________________________  EKG  None ____________________________________________  RADIOLOGY  None ____________________________________________    PROCEDURES  Procedure(s) performed: None    Medications  fluorescein ophthalmic strip 1 strip (1 strip Right Eye Given by Other 03/29/16 1458)  tetracaine (PONTOCAINE) 0.5 % ophthalmic solution 2 drop (2 drops Right Eye Given by Other 03/29/16 1458)     ____________________________________________   INITIAL IMPRESSION / ASSESSMENT AND PLAN / ED COURSE  Patient's diagnosis is consistent with corneal abrasion due to contact lens of the right eye. Patient will be discharged home with prescriptions for erythromycin ophthalmic ointment to use as directed. Patient was given tetracaine drops to utilize 2 drops every 6 hours as needed for today only. She verbalized understanding that she would not utilize tetracaine drops after today. She also verbalized understanding that she could not wear a contact lens in the right eye until seen by [facility] and cleared to do so. She will be following up with [facility] tomorrow for recheck and verbalized understanding of such. Patient is given ED precautions to return to the ED for any worsening or new symptoms.    ____________________________________________  FINAL CLINICAL IMPRESSION(S) / ED DIAGNOSES  Final diagnoses:  Corneal abrasion due to contact lens, right      NEW  MEDICATIONS STARTED DURING THIS VISIT:  New Prescriptions   ERYTHROMYCIN OPHTHALMIC OINTMENT    Place 1 application into the right eye 4 (four) times daily. Apply 1cm ribbon to lower eyelid 4 times daily.         Hope Pigeon, PA-C 03/29/16 1537  Myrna Blazer, MD 03/29/16 [phone]

## 2016-06-15 ENCOUNTER — Emergency Department
Admission: EM | Admit: 2016-06-15 | Discharge: 2016-06-15 | Disposition: A | Discharge status: Home / Self Care | Payer: Self-pay | Attending: Emergency Medicine | Admitting: Emergency Medicine

## 2016-06-15 ENCOUNTER — Emergency Department: Payer: Self-pay

## 2016-06-15 DIAGNOSIS — F17210 Nicotine dependence, cigarettes, uncomplicated: Secondary | ICD-10-CM | POA: Insufficient documentation

## 2016-06-15 DIAGNOSIS — S5011XA Contusion of right forearm, initial encounter: Secondary | ICD-10-CM | POA: Insufficient documentation

## 2016-06-15 DIAGNOSIS — Y929 Unspecified place or not applicable: Secondary | ICD-10-CM | POA: Insufficient documentation

## 2016-06-15 DIAGNOSIS — Y939 Activity, unspecified: Secondary | ICD-10-CM | POA: Insufficient documentation

## 2016-06-15 DIAGNOSIS — Y999 Unspecified external cause status: Secondary | ICD-10-CM | POA: Insufficient documentation

## 2016-06-15 DIAGNOSIS — J45909 Unspecified asthma, uncomplicated: Secondary | ICD-10-CM | POA: Insufficient documentation

## 2016-06-15 DIAGNOSIS — W03XXXA Other fall on same level due to collision with another person, initial encounter: Secondary | ICD-10-CM | POA: Insufficient documentation

## 2016-06-15 MED ORDER — NAPROXEN 500 MG PO TABS: 500.0000 mg | ORAL_TABLET | Freq: Two times a day (BID) | ORAL | Status: DC

## 2016-06-15 NOTE — ED Provider Notes (Signed)
[facility] Emergency Department Provider Note ____________________________________________  Time seen: Approximately 2:06 PM  I have reviewed the triage vital signs and the nursing notes.   HISTORY  Chief Complaint Arm Pain    HPI Leah Gates is a 35 y.o. female who presents to the emergency department for evaluation of right arm pain. She states that her bedroom door was off of the hinges and someone bumped into it and it fell onto her arm. She reports pain increases with extension and rotation of her forearm and wrist. No previous fractures of the right arm.  Past Medical History  Diagnosis Date  . Asthma     There are no active problems to display for this patient.   Past Surgical History  Procedure Laterality Date  . Dilation and curettage of uterus      Current Outpatient Rx  Name  Route  Sig  Dispense  Refill  . diazepam (VALIUM) 2 MG tablet   Oral   Take 1 tablet (2 mg total) by mouth every 8 (eight) hours as needed for muscle spasms.   9 tablet   0   . erythromycin ophthalmic ointment   Right Eye   Place 1 application into the right eye 4 (four) times daily. Apply 1cm ribbon to lower eyelid 4 times daily.   3.5 g   0   . mupirocin ointment (BACTROBAN) 2 %      Apply to affected area 3 times daily   22 g   0   . naproxen (NAPROSYN) 500 MG tablet   Oral   Take 1 tablet (500 mg total) by mouth 2 (two) times daily with a meal.   30 tablet   0     Allergies Penicillins  No family history on file.  Social History Social History  Substance Use Topics  . Smoking status: Current Every Day Smoker -- 1.00 packs/day    Types: Cigarettes  . Smokeless tobacco: Not on file  . Alcohol Use: No    Review of Systems Constitutional: No recent illness. Cardiovascular: Denies chest pain or palpitations. Respiratory: Denies shortness of breath. Musculoskeletal: Pain in right forearm/elbow. Skin: Negative for rash, wound,  lesion. Neurological: Negative for focal weakness or numbness.  ____________________________________________   PHYSICAL EXAM:  VITAL SIGNS: ED Triage Vitals  Enc Vitals Group     BP 06/15/16 1254 110/65 mmHg     Pulse Rate 06/15/16 1254 70     Resp 06/15/16 1254 18     Temp 06/15/16 1254 98.2 F (36.8 C)     Temp Source 06/15/16 1254 Oral     SpO2 06/15/16 1254 100 %     Weight 06/15/16 1254 120 lb (54.432 kg)     Height 06/15/16 1254  (1.626 m)     Head Cir --      Peak Flow --      Pain Score 06/15/16 1255 9     Pain Loc --      Pain Edu? --      Excl. in GC? --     Constitutional: Alert and oriented. Well appearing and in no acute distress. Eyes: Conjunctivae are normal. EOMI. Head: Atraumatic. Neck: No stridor.  Respiratory: Normal respiratory effort.   Musculoskeletal: Tenderness over the radial head that radiates into the dorsal aspect of the forearm with attempt to extend.  Neurologic:  Normal speech and language. No gross focal neurologic deficits are appreciated. Speech is normal. No gait instability. Skin:  Skin is warm, dry and intact. Atraumatic. Psychiatric: Mood and affect are normal. Speech and behavior are normal.  ____________________________________________   LABS (all labs ordered are listed, but only abnormal results are displayed)  Labs Reviewed - No data to display ____________________________________________  RADIOLOGY  Negative for acute bony abnormality per radiology. I, Kem Boroughsari Nathaneal Sommers, personally viewed and evaluated these images (plain radiographs) as part of my medical decision making, as well as reviewing the written report by the radiologist.   ____________________________________________   PROCEDURES  Procedure(s) performed:   Shoulder sling applied to the right arm by RN.   ____________________________________________   INITIAL IMPRESSION / ASSESSMENT AND PLAN / ED COURSE  Pertinent labs & imaging results that were  available during my care of the patient were reviewed by me and considered in my medical decision making (see chart for details).  Patient instructed to wear the sling for 3 days, then remove it. She was instructed to follow up with PCP or orthopedics for symptoms that are not improving over the week. She was advised to return to the ER for symptoms that change or worsen if unable to schedule an appointment.  ____________________________________________   FINAL CLINICAL IMPRESSION(S) / ED DIAGNOSES  Final diagnoses:  Contusion of right lower arm, initial encounter       Chinita PesterCari B Deiondre Harrower, FNP 06/15/16 1412  Richardean Canalavid H Yao, MD 06/15/16 1537

## 2016-06-15 NOTE — ED Notes (Signed)
NP, Kem Boroughsari Triplett in room to discuss discharge with patient.

## 2016-06-15 NOTE — Discharge Instructions (Signed)

## 2016-06-15 NOTE — ED Notes (Signed)
Patient presents to the ED with right arm pain since 1am when her bedroom door fell on patient's arm.  Patient states, "if I keep it really still it doesn't hurt that bad but if I move it at all it hurts really bad."  Patient is in no obvious distress at this time.

## 2016-06-21 ENCOUNTER — Encounter: Payer: Self-pay | Admitting: Emergency Medicine

## 2016-06-21 ENCOUNTER — Emergency Department
Admission: EM | Admit: 2016-06-21 | Discharge: 2016-06-21 | Disposition: A | Discharge status: Home / Self Care | Payer: No Typology Code available for payment source | Attending: Emergency Medicine | Admitting: Emergency Medicine

## 2016-06-21 DIAGNOSIS — K047 Periapical abscess without sinus: Secondary | ICD-10-CM | POA: Insufficient documentation

## 2016-06-21 DIAGNOSIS — J45909 Unspecified asthma, uncomplicated: Secondary | ICD-10-CM | POA: Insufficient documentation

## 2016-06-21 DIAGNOSIS — F17210 Nicotine dependence, cigarettes, uncomplicated: Secondary | ICD-10-CM | POA: Insufficient documentation

## 2016-06-21 MED ORDER — CLINDAMYCIN HCL 150 MG PO CAPS: 450.0000 mg | ORAL_CAPSULE | Freq: Three times a day (TID) | ORAL | Status: AC

## 2016-06-21 MED ORDER — CLINDAMYCIN HCL 150 MG PO CAPS
450.0000 mg | ORAL_CAPSULE | Freq: Once | ORAL | Status: AC
  Administered 2016-06-21: 450 mg via ORAL
  Filled 2016-06-21 (×2): qty 3

## 2016-06-21 MED ORDER — OXYCODONE-ACETAMINOPHEN 5-325 MG PO TABS: 1.0000 | ORAL_TABLET | Freq: Four times a day (QID) | ORAL | Status: DC | PRN

## 2016-06-21 NOTE — ED Notes (Signed)
Pt presents with c/o L sided facial swelling. Pt states yesterday began having upper L sided dental pain that started yesterday, states she woke up today and presents with redness and swelling to L side of her face. NAD noted at this time.

## 2016-06-21 NOTE — Discharge Instructions (Signed)
OPTIONS FOR DENTAL FOLLOW UP CARE ° °[place] [facility] and [facility] - Local Safety Net Dental Clinics °http://www.ncdhhs.gov/dph/oralhealth/services/safetynetclinics.htm °  °[facility] (336-562-3123) ° °[facility] [place] ([phone]) ° °[facility] ([phone] ext [phone]) ° °[facility] ([phone]) ° °[facility] ([phone]) °This clinic caters to the indigent population and is on a lottery system. °Location: °[facility], [address], [place] °Clinic Hours: °Wednesdays from 6pm - 9pm, patients seen by a lottery system. °For dates, call or go to [email or web address].[facility].edu/[facility]/patients/Dental-[facility] °Services: °Cleanings, fillings and simple extractions. °Payment Options: °DENTAL WORK IS FREE OF CHARGE. Bring proof of income or support. °Best way to get seen: °Arrive at 5:15 pm - this is a lottery, NOT first come/first serve, so arriving earlier will not increase your chances of being seen. °  °  °[facility] °[phone] °Select option 1 for emergencies °  °Location: °[facility], [address], [place] °Clinic Hours: °No walk-ins accepted - call the day before to schedule an appointment. °Check in times are 9:30 am and 1:30 pm. °Services: °Simple extractions, temporary fillings, pulpectomy/pulp debridement, uncomplicated abscess drainage. °Payment Options: °PAYMENT IS DUE AT THE TIME OF SERVICE.  Fee is usually $100-200, additional surgical procedures (e.g. abscess drainage) may be extra. °Cash, checks, Visa/MasterCard accepted.  Can file Medicaid if patient is covered for dental - patient should call case worker to check. °No discount for [facility] patients. °Best way to get seen: °MUST call the day before and get onto the schedule. Can usually be seen the next 1-2 days. No walk-ins accepted. °  °  °[facility] °[phone] °   °Location: °[facility], [address], [place] °Clinic Hours: °M, W, Th, F 8am or 1:30pm, Tues 9a or 1:30 - first come/first served. °Services: °Simple extractions, temporary fillings, uncomplicated abscess drainage.  You do not need to be an [place] resident. °Payment Options: °PAYMENT IS DUE AT THE TIME OF SERVICE. °Dental insurance, otherwise sliding scale - bring proof of income or support. °Depending on income and treatment needed, cost is usually $50-200. °Best way to get seen: °Arrive early as it is first come/first served. °  °  °[facility] °[phone] °  °Location: °[address] °Clinic Hours: °Mon-Thu 8a-5p °Services: °Most basic dental services including extractions and fillings. °Payment Options: °PAYMENT IS DUE AT THE TIME OF SERVICE. °Sliding scale, up to 50% off - bring proof if income or support. °Medicaid with dental option accepted. °Best way to get seen: °Call to schedule an appointment, can usually be seen within 2 weeks OR they will try to see walk-ins - show up at 8a or 2p (you may have to wait). °  °  °[facility] °[phone] °[place] RESIDENTS ONLY °  °Location: °[facility], [address], [place], [place] [address] °Clinic Hours: By appointment only. °Monday - Thursday 8am-5pm, Friday 8am-12pm °Services: Cleanings, fillings, extractions. °Payment Options: °PAYMENT IS DUE AT THE TIME OF SERVICE. °Cash, Visa or MasterCard. Sliding scale - $30 minimum per service. °Best way to get seen: °Come in to office, complete packet and make an appointment - need proof of income °or support monies for each household member and proof of [place] residence. °Usually takes about a month to get in. °  °  °[facility] °[phone] °  °Location: °[address]   [place] °Clinic Hours: Walk-in Urgent Care Dental Services are offered Monday-Friday  mornings only. °The numbers of emergencies accepted daily is limited to the number of °providers available. °Maximum 15 - Mondays, Wednesdays & Thursdays °Maximum 10 - Tuesdays & Fridays °Services: °You do not need to be a [place] resident to be seen for a dental emergency. °Emergencies are defined as pain, swelling, abnormal bleeding, or dental trauma. Walkins will receive x-rays if needed. °NOTE: Dental cleaning is not an emergency. °Payment Options: °PAYMENT IS DUE AT THE TIME OF SERVICE. °Minimum co-pay is $40.00 for uninsured patients. °Minimum co-pay is $3.00 for Medicaid with dental coverage. °Dental Insurance is accepted and must be presented at time of visit. °Medicare does not cover dental. °Forms of payment: Cash, credit card, checks. °Best way to get seen: °If not previously registered with the clinic, walk-in dental registration begins at 7:15 am and is on a first come/first serve basis. °If previously registered with the clinic, call to make an appointment. °  °  °[facility] °[phone] °[place] RESIDENTS ONLY °  °Location: °[address], [place] °Clinic Hours: °Mon-Thu 10a-2p °Services: Extractions only! °Payment Options: °FREE (donations accepted) - bring proof of income or support °Best way to get seen: °Call and schedule an appointment OR come at 8am on the 1st Monday of every month (except for holidays) when it is first come/first served. °  °  °[facility] °[phone] °  °Location: °[address], [place] °Clinic Hours: °Friday mornings °Services, Payment Options, Best way to get seen: °Call for info ° ° °Dental Abscess °A dental abscess is a collection of pus in or around a tooth. °CAUSES °This condition is caused by a bacterial infection around the root of the tooth that involves the inner part of the tooth (pulp). It may result from: °· Severe tooth decay. °· Trauma to the tooth that allows bacteria to enter into the pulp, such as a broken or chipped  tooth. °· Severe gum disease around a tooth. °SYMPTOMS °Symptoms of this condition include: °· Severe pain in and around the infected tooth. °· Swelling and redness around the infected tooth, in the mouth, or in the face. °· Tenderness. °· Pus drainage. °· Bad breath. °· Bitter taste in the mouth. °· Difficulty swallowing. °· Difficulty opening the mouth. °· Nausea. °· Vomiting. °· Chills. °· Swollen neck glands. °· Fever. °DIAGNOSIS °This condition is diagnosed with examination of the infected tooth. During the exam, your dentist may tap on the infected tooth. Your dentist will also ask about your medical and dental history and may order X-rays. °TREATMENT °This condition is treated by eliminating the infection. This may be done with: °· Antibiotic medicine. °· A root canal. This may be performed to save the tooth. °· Pulling (extracting) the tooth. This may also involve draining the abscess. This is done if the tooth cannot be saved. °HOME CARE INSTRUCTIONS °· Take medicines only as directed by your dentist. °· If you were prescribed antibiotic medicine, finish all of it even if you start to feel better. °· Rinse your mouth (gargle) often with salt water to relieve pain or swelling. °· Do not drive or operate heavy machinery while taking pain medicine. °· Do not apply heat to the outside of your mouth. °· Keep all follow-up visits as directed by your dentist. This is important. °SEEK MEDICAL CARE IF: °· Your pain is worse and is not helped by medicine. °SEEK IMMEDIATE MEDICAL CARE IF: °·   You have a fever or chills. °· Your symptoms suddenly get worse. °· You have a very bad headache. °· You have problems breathing or swallowing. °· You have trouble opening your mouth. °· You have swelling in your neck or around your eye. °  °This information is not intended to replace advice given to you by your health care provider. Make sure you discuss any questions you have with your health care provider. °  °Document  Released: 11/23/2005 Document Revised: 04/09/2015 Document Reviewed: 11/20/2014 °Elsevier Interactive Patient Education ©2016 [employer] ° °

## 2016-06-21 NOTE — ED Notes (Signed)
Pt reports taking tylenol, IBU and naproxen all within the last 24 hr without effect

## 2016-06-21 NOTE — ED Provider Notes (Signed)
[facility] Emergency Department Provider Note   ____________________________________________  Time seen: Approximately 450 PM  I have reviewed the triage vital signs and the nursing notes.   HISTORY  Chief Complaint Dental Pain and Facial Swelling   HPI Leah Gates is a 35 y.o. female with a history of asthma who is presenting emergency department with left sided facial pain and swelling. She says the pain started yesterday and that she woke up with swelling this morning. She says that she has had multiple dental infections in the past but they usually go away on the room. She says that she smokes but does not use any drugs or drink. Does not report any fever.   Past Medical History  Diagnosis Date  . Asthma     There are no active problems to display for this patient.   Past Surgical History  Procedure Laterality Date  . Dilation and curettage of uterus      Current Outpatient Rx  Name  Route  Sig  Dispense  Refill  . diazepam (VALIUM) 2 MG tablet   Oral   Take 1 tablet (2 mg total) by mouth every 8 (eight) hours as needed for muscle spasms.   9 tablet   0   . erythromycin ophthalmic ointment   Right Eye   Place 1 application into the right eye 4 (four) times daily. Apply 1cm ribbon to lower eyelid 4 times daily.   3.5 g   0   . mupirocin ointment (BACTROBAN) 2 %      Apply to affected area 3 times daily   22 g   0   . naproxen (NAPROSYN) 500 MG tablet   Oral   Take 1 tablet (500 mg total) by mouth 2 (two) times daily with a meal.   30 tablet   0     Allergies Penicillins  History reviewed. No pertinent family history.  Social History Social History  Substance Use Topics  . Smoking status: Current Every Day Smoker -- 1.00 packs/day    Types: Cigarettes  . Smokeless tobacco: Never Used  . Alcohol Use: No    Review of Systems Constitutional: No fever/chills Eyes: No visual changes. ENT: No sore  throat. Cardiovascular: Denies chest pain. Respiratory: Denies shortness of breath. Gastrointestinal: No abdominal pain.  No nausea, no vomiting.  No diarrhea.  No constipation. Genitourinary: Negative for dysuria. Musculoskeletal: Negative for back pain. Skin: Negative for rash. Neurological: Negative for headaches, focal weakness or numbness.  10-point ROS otherwise negative.  ____________________________________________   PHYSICAL EXAM:  VITAL SIGNS: ED Triage Vitals  Enc Vitals Group     BP 06/21/16 1625 114/64 mmHg     Pulse Rate 06/21/16 1625 81     Resp 06/21/16 1625 20     Temp 06/21/16 1625 98.3 F (36.8 C)     Temp Source 06/21/16 1625 Oral     SpO2 06/21/16 1625 98 %     Weight 06/21/16 1625 120 lb (54.432 kg)     Height 06/21/16 1625  (1.626 m)     Head Cir --      Peak Flow --      Pain Score 06/21/16 1625 8     Pain Loc --      Pain Edu? --      Excl. in GC? --     Constitutional: Alert and oriented. Well appearing and in no acute distress. Eyes: Conjunctivae are normal. PERRL. EOMI. Head: Atraumatic.  Nose: No congestion/rhinnorhea. Mouth/Throat: Mucous membranes are moist.  Grossly, the patient has left-sided facial swelling which is more concentrated to the left maxilla laterally. On examination of the mouth the patient has poor dentition throughout but severe erosion of the maxillary molars. There is no pus drainage or fluctuance palpable to the gums or oral/buccal mucosa. There is tenderness to the cheek overlying the swelling. Patient does not have any tongue swelling. No respiratory distress and controlling secretions and speaking with a normal voice. Neck: No stridor.   Cardiovascular: Normal rate, regular rhythm. Grossly normal heart sounds.   Respiratory: Normal respiratory effort.  No retractions. Lungs CTAB. Gastrointestinal: Soft and nontender. No distention.  Musculoskeletal: No lower extremity tenderness nor edema. Neurologic:  Normal  speech and language. No gross focal neurologic deficits are appreciated. No gait instability. Skin:  Skin is warm, dry and intact. No rash noted. Psychiatric: Mood and affect are normal. Speech and behavior are normal.  ____________________________________________   LABS (all labs ordered are listed, but only abnormal results are displayed)  Labs Reviewed - No data to display ____________________________________________  EKG   ____________________________________________  RADIOLOGY   ____________________________________________   PROCEDURES    Procedures    ____________________________________________   INITIAL IMPRESSION / ASSESSMENT AND PLAN / ED COURSE  Pertinent labs & imaging results that were available during my care of the patient were reviewed by me and considered in my medical decision making (see chart for details).  Patient reports that she does not have insurance. However, she says that she also has an allergy to penicillin. We'll give clindamycin, first dose here, as well as a coupon for clindamycin at a pharmacy. We'll give a prescription for Percocet for pain control. Cannot give dose here because the patient drove to the emergency department. I did review her substance registry record and she does have several other controlled substances prescribed her but not since this past March. She understands the plan for discharge and is willing to comply. Will also be given a list of local dental clinics and she says that she'll call tomorrow for an urgent follow-up appointment to be seen within the week. ____________________________________________   FINAL CLINICAL IMPRESSION(S) / ED DIAGNOSES  Dental abscess.    NEW MEDICATIONS STARTED DURING THIS VISIT:  New Prescriptions   No medications on file     Note:  This document was prepared using Dragon voice recognition software and may include unintentional dictation errors.    Myrna Blazeravid Matthew Schaevitz,  MD 06/21/16 [phone]

## 2016-12-04 ENCOUNTER — Encounter: Payer: Self-pay | Admitting: Emergency Medicine

## 2016-12-04 ENCOUNTER — Emergency Department
Admission: EM | Admit: 2016-12-04 | Discharge: 2016-12-04 | Disposition: A | Discharge status: Home / Self Care | Payer: Self-pay | Attending: Emergency Medicine | Admitting: Emergency Medicine

## 2016-12-04 DIAGNOSIS — B029 Zoster without complications: Secondary | ICD-10-CM | POA: Insufficient documentation

## 2016-12-04 DIAGNOSIS — F17210 Nicotine dependence, cigarettes, uncomplicated: Secondary | ICD-10-CM | POA: Insufficient documentation

## 2016-12-04 DIAGNOSIS — Z79899 Other long term (current) drug therapy: Secondary | ICD-10-CM | POA: Insufficient documentation

## 2016-12-04 DIAGNOSIS — J45909 Unspecified asthma, uncomplicated: Secondary | ICD-10-CM | POA: Insufficient documentation

## 2016-12-04 MED ORDER — HYDROCODONE-ACETAMINOPHEN 5-325 MG PO TABS: 1.0000 | ORAL_TABLET | ORAL | 0 refills | Status: DC | PRN

## 2016-12-04 MED ORDER — ACYCLOVIR 400 MG PO TABS
400.0000 mg | ORAL_TABLET | Freq: Every day | ORAL | 0 refills | Status: AC
Start: 2016-12-04 — End: 2016-12-11

## 2016-12-04 MED ORDER — ACYCLOVIR 400 MG PO TABS: 400.0000 mg | ORAL_TABLET | Freq: Every day | ORAL | 0 refills | Status: DC

## 2016-12-04 NOTE — ED Provider Notes (Signed)
[facility] Emergency Department Provider Note  ____________________________________________   First MD Initiated Contact with Patient 12/04/16 1314     (approximate)  I have reviewed the triage vital signs and the nursing notes.   HISTORY  Chief Complaint Rash   HPI Leah LittlerMonica M Gates is a 35 y.o. female 0 complaint of a rash that began last night as one blister on the right shoulder and this morning and has increased in size as well as increased in pain. Patient has a history of shingles in her right arm and has had recurrent shingles in the past. Patient has been [facility] in the past but did not have insurance and came to the emergency room to be treated. Currently she rates her pain as a 7 out of 10.   Past Medical History:  Diagnosis Date  . Asthma     There are no active problems to display for this patient.   Past Surgical History:  Procedure Laterality Date  . DILATION AND CURETTAGE OF UTERUS      Prior to Admission medications   Medication Sig Start Date End Date Taking? Authorizing Provider  acyclovir (ZOVIRAX) 400 MG tablet Take 1 tablet (400 mg total) by mouth 5 (five) times daily. 12/04/16 12/11/16  Tommi Rumpshonda L Ruqayya Ventress, PA-C  HYDROcodone-acetaminophen (NORCO/VICODIN) 5-325 MG tablet Take 1 tablet by mouth every 4 (four) hours as needed for moderate pain. 12/04/16   Tommi Rumpshonda L Robyn Nohr, PA-C  mupirocin ointment Idelle Jo(BACTROBAN) 2 % Apply to affected area 3 times daily 01/19/16 01/18/17  Tommi Rumpshonda L Gabor Lusk, PA-C    Allergies Penicillins  No family history on file.  Social History Social History  Substance Use Topics  . Smoking status: Current Every Day Smoker    Packs/day: 1.00    Types: Cigarettes  . Smokeless tobacco: Never Used  . Alcohol use No    Review of Systems Constitutional: No fever/chills Cardiovascular: Denies chest pain. Respiratory: Denies shortness of breath. Gastrointestinal: No abdominal pain.   No nausea, no vomiting.   Musculoskeletal: Positive right shoulder/upper arm pain. Skin: Positive rash right upper arm. Neurological: Negative for headaches, focal weakness or numbness.  10-point ROS otherwise negative.  ____________________________________________   PHYSICAL EXAM:  VITAL SIGNS: ED Triage Vitals  Enc Vitals Group     BP 12/04/16 1245 103/69     Pulse Rate 12/04/16 1245 (!) 102     Resp 12/04/16 1245 18     Temp 12/04/16 1245 97.7 F (36.5 C)     Temp Source 12/04/16 1245 Oral     SpO2 12/04/16 1245 97 %     Weight 12/04/16 1245 120 lb (54.4 kg)     Height 12/04/16 1245 5\' 4"  (1.626 m)     Head Circumference --      Peak Flow --      Pain Score 12/04/16 1246 7     Pain Loc --      Pain Edu? --      Excl. in GC? --     Constitutional: Alert and oriented. Well appearing and in no acute distress. Eyes: Conjunctivae are normal. PERRL. EOMI. Head: Atraumatic. Nose: No congestion/rhinnorhea. Neck: No stridor.   Cardiovascular: Normal rate, regular rhythm. Grossly normal heart sounds.  Good peripheral circulation. Respiratory: Normal respiratory effort.  No retractions. Lungs CTAB. Musculoskeletal: Right upper arm no gross deformity is noted. Range of motion is within normal limits. No limitation. Neurologic:  Normal speech and language. No gross focal neurologic  deficits are appreciated. No gait instability. Skin:  Skin is warm, dry and intact. On examination of the right deltoid area there is a cluster of vesicles present. There are no other the sclerae at this time. All vesicles  are intact at this time. Psychiatric: Mood and affect are normal. Speech and behavior are normal.  ____________________________________________   LABS (all labs ordered are listed, but only abnormal results are displayed)  Labs Reviewed - No data to display   PROCEDURES  Procedure(s) performed: None  Procedures  Critical Care performed:  No  ____________________________________________   INITIAL IMPRESSION / ASSESSMENT AND PLAN / ED COURSE  Pertinent labs & imaging results that were available during my care of the patient were reviewed by me and considered in my medical decision making (see chart for details).    Clinical Course    Patient currently does not have any insurance and was prescribed acyclovir 400 mg 5 tablets per day for the next 7 days. She is also given a prescription for Norco as needed for pain. She is to [facility] or [facility] if any continued problems. Patient is aware that she should not expose herself to elderly, pregnant females, young children or anyone on chemotherapy.  ____________________________________________   FINAL CLINICAL IMPRESSION(S) / ED DIAGNOSES  Final diagnoses:  Herpes zoster without complication      NEW MEDICATIONS STARTED DURING THIS VISIT:  Discharge Medication List as of 12/04/2016  2:06 PM    START taking these medications   Details  HYDROcodone-acetaminophen (NORCO/VICODIN) 5-325 MG tablet Take 1 tablet by mouth every 4 (four) hours as needed for moderate pain., Starting Fri 12/04/2016, Print         Note:  This document was prepared using Dragon voice recognition software and may include unintentional dictation errors.    Tommi RumpsRhonda L Heide Brossart, PA-C 12/04/16 1421    Jennye MoccasinBrian S Quigley, MD 12/04/16 [phone]

## 2016-12-04 NOTE — ED Notes (Signed)
See triage note  States she has a hx of shingles and noticed a rash area to right shoulder last pm  Woke up this am with increased pain which is radiating into neck

## 2016-12-04 NOTE — ED Triage Notes (Signed)
Reports hx of shingles , feels like it has returned to right shoulder.  Mask applied.

## 2016-12-04 NOTE — Discharge Instructions (Signed)
Follow-up with your doctor at [facility] or [facility] if any continued problems. Norco as needed for pain. Acyclovir for the next 7 days. Avoid exposure to women who are pregnant, people on chemotherapy or radiation, and small children.

## 2017-01-05 ENCOUNTER — Emergency Department
Admission: EM | Admit: 2017-01-05 | Discharge: 2017-01-05 | Discharge status: Against Medical Advice | Payer: No Typology Code available for payment source

## 2017-01-25 ENCOUNTER — Encounter: Payer: Self-pay | Admitting: *Deleted

## 2017-01-25 ENCOUNTER — Emergency Department
Admission: EM | Admit: 2017-01-25 | Discharge: 2017-01-25 | Disposition: A | Discharge status: Home / Self Care | Payer: Self-pay | Attending: Emergency Medicine | Admitting: Emergency Medicine

## 2017-01-25 DIAGNOSIS — Y998 Other external cause status: Secondary | ICD-10-CM | POA: Insufficient documentation

## 2017-01-25 DIAGNOSIS — X58XXXA Exposure to other specified factors, initial encounter: Secondary | ICD-10-CM | POA: Insufficient documentation

## 2017-01-25 DIAGNOSIS — Y929 Unspecified place or not applicable: Secondary | ICD-10-CM | POA: Insufficient documentation

## 2017-01-25 DIAGNOSIS — S76112A Strain of left quadriceps muscle, fascia and tendon, initial encounter: Secondary | ICD-10-CM | POA: Insufficient documentation

## 2017-01-25 DIAGNOSIS — J45909 Unspecified asthma, uncomplicated: Secondary | ICD-10-CM | POA: Insufficient documentation

## 2017-01-25 DIAGNOSIS — F17210 Nicotine dependence, cigarettes, uncomplicated: Secondary | ICD-10-CM | POA: Insufficient documentation

## 2017-01-25 DIAGNOSIS — Y9361 Activity, american tackle football: Secondary | ICD-10-CM | POA: Insufficient documentation

## 2017-01-25 MED ORDER — TRAMADOL HCL 50 MG PO TABS: 50.0000 mg | ORAL_TABLET | Freq: Every day | ORAL | 0 refills | Status: AC

## 2017-01-25 MED ORDER — KETOROLAC TROMETHAMINE 60 MG/2ML IM SOLN
30.0000 mg | Freq: Once | INTRAMUSCULAR | Status: AC
  Administered 2017-01-25: 30 mg via INTRAMUSCULAR
  Filled 2017-01-25: qty 2

## 2017-01-25 MED ORDER — MELOXICAM 15 MG PO TABS: 15.0000 mg | ORAL_TABLET | Freq: Every day | ORAL | 0 refills | Status: AC

## 2017-01-25 NOTE — ED Notes (Signed)
States she is having pain to left thigh area  Denies any recent injury  States pain started after playing flag f/b 2-3 years ago

## 2017-01-25 NOTE — ED Provider Notes (Signed)
[facility] Emergency Department Provider Note  ____________________________________________  Time seen: Approximately 1:21 PM  I have reviewed the triage vital signs and the nursing notes.   HISTORY  Chief Complaint Leg Pain    HPI Leah Gates is a 36 y.o. female that presents to the emergency department with left thigh pain since yesterday. She states that she was playing flag football and used that she should take it easy because she has injured this leg before. Patient denies any falls or trauma. Patient states that after several hours playing her quad muscle began hurting. Patient states that she had similar pain 3 years ago when she tore her quad muscle. Patient states that she is concerned that she tore it again as she is concerned that it never healed properly. Last time, patient had knee wrapped, took ibuprofen, and used crutches until symptoms improved. Patient states that she came to the emergency room and not orthopedics because she does not have $100 co-pay to see orthopedics. Patient denies headache, shortness of breath, chest pain, nausea, vomiting, abdominal pain.   Past Medical History:  Diagnosis Date  . Asthma     There are no active problems to display for this patient.   Past Surgical History:  Procedure Laterality Date  . DILATION AND CURETTAGE OF UTERUS      Prior to Admission medications   Medication Sig Start Date End Date Taking? Authorizing Provider  HYDROcodone-acetaminophen (NORCO/VICODIN) 5-325 MG tablet Take 1 tablet by mouth every 4 (four) hours as needed for moderate pain. 12/04/16   Tommi Rumps, PA-C  meloxicam (MOBIC) 15 MG tablet Take 1 tablet (15 mg total) by mouth daily. 01/25/17 02/04/17  Enid Derry, PA-C  traMADol (ULTRAM) 50 MG tablet Take 1 tablet (50 mg total) by mouth at bedtime. 01/25/17 01/29/17  Enid Derry, PA-C    Allergies Penicillins  History reviewed. No pertinent family history.  Social  History Social History  Substance Use Topics  . Smoking status: Current Every Day Smoker    Packs/day: 1.00    Types: Cigarettes  . Smokeless tobacco: Never Used  . Alcohol use No     Review of Systems  Constitutional: No fever/chills ENT: No upper respiratory complaints. Cardiovascular: No chest pain. Respiratory: No cough. No SOB. Gastrointestinal: No abdominal pain.  No nausea, no vomiting.  Skin: Negative for rash, abrasions, lacerations, ecchymosis. Neurological: Negative for headaches, numbness or tingling   ____________________________________________   PHYSICAL EXAM:  VITAL SIGNS: ED Triage Vitals [01/25/17 1235]  Enc Vitals Group     BP 125/70     Pulse Rate (!) 101     Resp 18     Temp 98.7 F (37.1 C)     Temp Source Oral     SpO2 99 %     Weight 120 lb (54.4 kg)     Height 5\' 4"  (1.626 m)     Head Circumference      Peak Flow      Pain Score 10     Pain Loc      Pain Edu?      Excl. in GC?      Constitutional: Alert and oriented. Well appearing and in no acute distress. Eyes: Conjunctivae are normal. PERRL. EOMI. Head: Atraumatic. ENT:      Ears:      Nose: No congestion/rhinnorhea.      Mouth/Throat: Mucous membranes are moist.  Neck: No stridor.  Cardiovascular: Normal rate, regular rhythm.  Good peripheral  circulation. Respiratory: Normal respiratory effort without tachypnea or retractions. Lungs CTAB. Good air entry to the bases with no decreased or absent breath sounds. Gastrointestinal: Bowel sounds 4 quadrants. Soft and nontender to palpation.  Musculoskeletal: No gross deformities appreciated. Limited range of motion of left knee. Mild tenderness to palpation over inferior quadriceps above patella. Mild swelling present. Neurologic:  Normal speech and language. No gross focal neurologic deficits are appreciated.  Skin:  Skin is warm, dry and intact. No rash noted. Psychiatric: Mood and affect are normal. Speech and behavior are normal.  Patient exhibits appropriate insight and judgement.   ____________________________________________   LABS (all labs ordered are listed, but only abnormal results are displayed)  Labs Reviewed - No data to display ____________________________________________  EKG   ____________________________________________  RADIOLOGY  No results found.  ____________________________________________    PROCEDURES  Procedure(s) performed:    Procedures    Medications  ketorolac (TORADOL) injection 30 mg (30 mg Intramuscular Given 01/25/17 1328)     ____________________________________________   INITIAL IMPRESSION / ASSESSMENT AND PLAN / ED COURSE  Pertinent labs & imaging results that were available during my care of the patient were reviewed by me and considered in my medical decision making (see chart for details).  Review of the [place] CSRS was performed in accordance of the [place] prior to dispensing any controlled drugs.     Patient's diagnosis is consistent with quadriceps strain. Vital signs and exam are reassuring. No indication for imaging at this time and patient agreed. Patient's knee was wrapped and patient was given crutches. Education was provided and all questions were answered. Patient will be discharged home with prescriptions for meloxicam and a very short course of tramadol to use at bedtime. Patient is to follow up with PCP as directed. Patient is given ED precautions to return to the ED for any worsening or new symptoms.     ____________________________________________  FINAL CLINICAL IMPRESSION(S) / ED DIAGNOSES  Final diagnoses:  Quadriceps strain, left, initial encounter      NEW MEDICATIONS STARTED DURING THIS VISIT:  New Prescriptions   MELOXICAM (MOBIC) 15 MG TABLET    Take 1 tablet (15 mg total) by mouth daily.   TRAMADOL (ULTRAM) 50 MG TABLET    Take 1 tablet (50 mg total) by mouth at bedtime.        This chart was dictated using voice  recognition software/Dragon. Despite best efforts to proofread, errors can occur which can change the meaning. Any change was purely unintentional.    Enid DerryAshley Teara Duerksen, PA-C 01/25/17 1915    Jene Everyobert Kinner, MD 01/27/17 1620

## 2017-01-25 NOTE — ED Triage Notes (Signed)
States left thigh pain after playing flag football, states 3 years ago she tore her quad

## 2017-03-03 ENCOUNTER — Emergency Department
Admission: EM | Admit: 2017-03-03 | Discharge: 2017-03-03 | Disposition: A | Discharge status: Home / Self Care | Payer: Self-pay | Attending: Student in an Organized Health Care Education/Training Program | Admitting: Student in an Organized Health Care Education/Training Program

## 2017-03-03 ENCOUNTER — Encounter: Payer: Self-pay | Admitting: Emergency Medicine

## 2017-03-03 DIAGNOSIS — J45909 Unspecified asthma, uncomplicated: Secondary | ICD-10-CM | POA: Insufficient documentation

## 2017-03-03 DIAGNOSIS — F17210 Nicotine dependence, cigarettes, uncomplicated: Secondary | ICD-10-CM | POA: Insufficient documentation

## 2017-03-03 DIAGNOSIS — J0100 Acute maxillary sinusitis, unspecified: Secondary | ICD-10-CM

## 2017-03-03 DIAGNOSIS — J029 Acute pharyngitis, unspecified: Secondary | ICD-10-CM

## 2017-03-03 MED ORDER — CETIRIZINE HCL 10 MG PO TABS: 10.0000 mg | ORAL_TABLET | Freq: Every day | ORAL | 0 refills | Status: DC

## 2017-03-03 MED ORDER — DOXYCYCLINE HYCLATE 100 MG PO TABS: 100.0000 mg | ORAL_TABLET | Freq: Two times a day (BID) | ORAL | 0 refills | Status: DC

## 2017-03-03 MED ORDER — FLUTICASONE PROPIONATE 50 MCG/ACT NA SUSP: 1.0000 | Freq: Two times a day (BID) | NASAL | 0 refills | Status: DC

## 2017-03-03 MED ORDER — MAGIC MOUTHWASH W/LIDOCAINE: 5.0000 mL | Freq: Four times a day (QID) | ORAL | 0 refills | Status: DC

## 2017-03-03 NOTE — ED Triage Notes (Signed)
Patient ambulatory to triage with steady gait, without difficulty or distress noted, mask in place; pt reports since this am having having to left side throat, denies fever or recent illness

## 2017-03-03 NOTE — ED Provider Notes (Signed)
[facility] Emergency Department Provider Note  ____________________________________________  Time seen: Approximately 7:34 PM  I have reviewed the triage vital signs and the nursing notes.   HISTORY  Chief Complaint Sore Throat    HPI Leah Gates is a 36 y.o. female who presents to emergency department complaining of sore throat. Patient reports that over the weekend she developed some nasal congestion and sneezing and mild sore throat. Sore throat improved and then worsened today. She reports that throat is sore on the left side. She has pain with swallowing but no difficulty with swallowing. She does endorse some sneezing and nasal congestion. She denies any headaches, visual changes, neck pain, chest pain, shortness of breath, abdominal pain, nausea or vomiting.   Past Medical History:  Diagnosis Date  . Asthma     There are no active problems to display for this patient.   Past Surgical History:  Procedure Laterality Date  . DILATION AND CURETTAGE OF UTERUS      Prior to Admission medications   Medication Sig Start Date End Date Taking? Authorizing Provider  cetirizine (ZYRTEC) 10 MG tablet Take 1 tablet (10 mg total) by mouth daily. 03/03/17   Delorise RoyalsJonathan D Aideliz Garmany, PA-C  doxycycline (VIBRA-TABS) 100 MG tablet Take 1 tablet (100 mg total) by mouth 2 (two) times daily. 03/03/17   Delorise RoyalsJonathan D Abbie Jablon, PA-C  fluticasone (FLONASE) 50 MCG/ACT nasal spray Place 1 spray into both nostrils 2 (two) times daily. 03/03/17   Delorise RoyalsJonathan D Angelena Sand, PA-C  HYDROcodone-acetaminophen (NORCO/VICODIN) 5-325 MG tablet Take 1 tablet by mouth every 4 (four) hours as needed for moderate pain. 12/04/16   Tommi Rumpshonda L Summers, PA-C  magic mouthwash w/lidocaine SOLN Take 5 mLs by mouth 4 (four) times daily. 03/03/17   Delorise RoyalsJonathan D Chrisoula Zegarra, PA-C    Allergies Penicillins  No family history on file.  Social History Social History  Substance Use Topics  . Smoking status:  Current Every Day Smoker    Packs/day: 1.00    Types: Cigarettes  . Smokeless tobacco: Never Used  . Alcohol use No     Review of Systems  Constitutional: No fever/chills Eyes: No visual changes. No discharge ENT: Positive for nasal congestion and sneezing. Positive for left-sided sore throat. Cardiovascular: no chest pain. Respiratory: no cough. No SOB. Gastrointestinal: No abdominal pain.  No nausea, no vomiting.  No diarrhea.  No constipation. Musculoskeletal: Negative for musculoskeletal pain. Skin: Negative for rash, abrasions, lacerations, ecchymosis. Neurological: Negative for headaches, focal weakness or numbness. 10-point ROS otherwise negative.  ____________________________________________   PHYSICAL EXAM:  VITAL SIGNS: ED Triage Vitals  Enc Vitals Group     BP 03/03/17 1921 113/84     Pulse Rate 03/03/17 1921 96     Resp 03/03/17 1921 18     Temp 03/03/17 1921 98.5 F (36.9 C)     Temp Source 03/03/17 1921 Oral     SpO2 03/03/17 1921 100 %     Weight 03/03/17 1921 121 lb (54.9 kg)     Height 03/03/17 1921 5\' 4"  (1.626 m)     Head Circumference --      Peak Flow --      Pain Score 03/03/17 1920 7     Pain Loc --      Pain Edu? --      Excl. in GC? --      Constitutional: Alert and oriented. Well appearing and in no acute distress. Eyes: Conjunctivae are normal. PERRL. EOMI. Head: Atraumatic. ENT:  Ears: EACs and TMs unremarkable bilaterally.      Nose: Turbinates are erythematous and edematous positive congestion/rhinnorhea. She is tender to percussion over left sided maxillary sinuses.      Mouth/Throat: Mucous membranes are moist. Oropharynx is non-edematous. Mildly erythematous. Tonsil and left is erythematous and mildly edematous. No exudates bilaterally. Uvula is midline. Neck: No stridor. Neck is supple with full range of motion Hematological/Lymphatic/Immunilogical: No cervical lymphadenopathy. Cardiovascular: Normal rate, regular rhythm.  Normal S1 and S2.  Good peripheral circulation. Respiratory: Normal respiratory effort without tachypnea or retractions. Lungs CTAB. Good air entry to the bases with no decreased or absent breath sounds. Musculoskeletal: Full range of motion to all extremities. No gross deformities appreciated. Neurologic:  Normal speech and language. No gross focal neurologic deficits are appreciated.  Skin:  Skin is warm, dry and intact. No rash noted. Psychiatric: Mood and affect are normal. Speech and behavior are normal. Patient exhibits appropriate insight and judgement.   ____________________________________________   LABS (all labs ordered are listed, but only abnormal results are displayed)  Labs Reviewed  CULTURE, GROUP A STREP [facility])  POCT RAPID STREP A   ____________________________________________  EKG   ____________________________________________  RADIOLOGY   No results found.  ____________________________________________    PROCEDURES  Procedure(s) performed:    Procedures    Medications - No data to display   ____________________________________________   INITIAL IMPRESSION / ASSESSMENT AND PLAN / ED COURSE  Pertinent labs & imaging results that were available during my care of the patient were reviewed by me and considered in my medical decision making (see chart for details).  Review of the [place] CSRS was performed in accordance of the [facility] prior to dispensing any controlled drugs.     Patient's diagnosis is consistent with maxillary sinusitis with postnasal drip causing left-sided pharyngitis. Patient has been afebrile. She has had some increased nasal congestion, sneezing, sinus pressure and no left-sided throat pain. Strep test was negative. No indication for peritonsillar abscesses this time with no changes in voice, midline uvula. Patient will be treated with antibiotics for sinusitis, Flonase and Zyrtec, magic mouthwash. She will follow-up primary care as  needed. Patient is given ED precautions to return to the ED for any worsening or new symptoms.     ____________________________________________  FINAL CLINICAL IMPRESSION(S) / ED DIAGNOSES  Final diagnoses:  Acute non-recurrent maxillary sinusitis  Sore throat      NEW MEDICATIONS STARTED DURING THIS VISIT:  New Prescriptions   CETIRIZINE (ZYRTEC) 10 MG TABLET    Take 1 tablet (10 mg total) by mouth daily.   DOXYCYCLINE (VIBRA-TABS) 100 MG TABLET    Take 1 tablet (100 mg total) by mouth 2 (two) times daily.   FLUTICASONE (FLONASE) 50 MCG/ACT NASAL SPRAY    Place 1 spray into both nostrils 2 (two) times daily.   MAGIC MOUTHWASH W/LIDOCAINE SOLN    Take 5 mLs by mouth 4 (four) times daily.        This chart was dictated using voice recognition software/Dragon. Despite best efforts to proofread, errors can occur which can change the meaning. Any change was purely unintentional.    Racheal Patches, PA-C 03/03/17 2041    Willy Eddy, MD 03/03/17 2216

## 2017-03-04 LAB — POCT RAPID STREP A: STREPTOCOCCUS, GROUP A SCREEN (DIRECT): NEGATIVE

## 2017-03-06 LAB — CULTURE, GROUP A STREP (THRC)

## 2017-03-09 LAB — HM HIV SCREENING LAB: HM HIV Screening: NEGATIVE

## 2017-03-09 LAB — HM PAP SMEAR: HM Pap smear: NEGATIVE

## 2017-11-21 ENCOUNTER — Other Ambulatory Visit: Payer: Self-pay

## 2017-11-21 ENCOUNTER — Emergency Department
Admission: EM | Admit: 2017-11-21 | Discharge: 2017-11-21 | Disposition: A | Discharge status: Home / Self Care | Payer: Self-pay | Attending: Student in an Organized Health Care Education/Training Program | Admitting: Student in an Organized Health Care Education/Training Program

## 2017-11-21 ENCOUNTER — Encounter: Payer: Self-pay | Admitting: Emergency Medicine

## 2017-11-21 DIAGNOSIS — J45909 Unspecified asthma, uncomplicated: Secondary | ICD-10-CM | POA: Insufficient documentation

## 2017-11-21 DIAGNOSIS — B029 Zoster without complications: Secondary | ICD-10-CM | POA: Insufficient documentation

## 2017-11-21 DIAGNOSIS — F17210 Nicotine dependence, cigarettes, uncomplicated: Secondary | ICD-10-CM | POA: Insufficient documentation

## 2017-11-21 HISTORY — DX: Zoster without complications: B02.9

## 2017-11-21 MED ORDER — HYDROCODONE-ACETAMINOPHEN 5-325 MG PO TABS: 1.0000 | ORAL_TABLET | ORAL | 0 refills | Status: DC | PRN

## 2017-11-21 MED ORDER — ACYCLOVIR 400 MG PO TABS: 400.0000 mg | ORAL_TABLET | Freq: Every day | ORAL | 0 refills | Status: AC

## 2017-11-21 MED ORDER — HYDROCODONE-ACETAMINOPHEN 5-325 MG PO TABS
1.0000 | ORAL_TABLET | Freq: Once | ORAL | Status: AC
  Administered 2017-11-21: 1 via ORAL
  Filled 2017-11-21: qty 1

## 2017-11-21 MED ORDER — PREDNISONE 10 MG PO TABS: 10.0000 mg | ORAL_TABLET | Freq: Every day | ORAL | 0 refills | Status: DC

## 2017-11-21 NOTE — ED Triage Notes (Signed)
Pt presents to ED via POV c/o possible shingles rash to R upper arm. Pt states she has had multiple recurrent episodes and they start the same way. Pt noted to have pink raised bumps to R shoulder and upper arm. C/o burning and itching.

## 2017-11-21 NOTE — ED Notes (Signed)
Pt placed in family waiting room by triage RN Helmut Musterlicia due to pt possibly having shingles

## 2017-11-21 NOTE — ED Notes (Signed)
Pt reports painful rash at right arm, hx of shingles  Right upper extremity appears with red bumps in linear fashion, pt reports very painful, pt denies SOB, CP,N/V/D

## 2017-11-21 NOTE — ED Provider Notes (Signed)
[facility] Emergency Department Provider Note    None    (approximate)  I have reviewed the triage vital signs and the nursing notes.   HISTORY  Chief Complaint Rash    HPI Carolann LittlerMonica M Thakur is a 36 y.o. female with a history of shingles typically in the right upper extremity presents with severe pain to the right posterior upper extremity starting this morning associated with Dermot rash.  Describes it as a burning sensation that is moderate to severe in nature.  Has not taken anything to relieve the pain.  Feels identical to previous episodes of shingles.  Last episode was earlier this year.  Denies any recent new medications trauma fevers nausea or vomiting.  No chest pain or shortness of breath.  Does not have PCP.  Past Medical History:  Diagnosis Date  . Asthma   . Shingles    History reviewed. No pertinent family history. Past Surgical History:  Procedure Laterality Date  . DILATION AND CURETTAGE OF UTERUS     There are no active problems to display for this patient.     Prior to Admission medications   Medication Sig Start Date End Date Taking? Authorizing Provider  acyclovir (ZOVIRAX) 400 MG tablet Take 1 tablet (400 mg total) by mouth 5 (five) times daily for 10 days. 11/21/17 12/01/17  Willy Eddyobinson, Jenniffer Vessels, MD  cetirizine (ZYRTEC) 10 MG tablet Take 1 tablet (10 mg total) by mouth daily. 03/03/17   Cuthriell, Delorise RoyalsJonathan D, PA-C  doxycycline (VIBRA-TABS) 100 MG tablet Take 1 tablet (100 mg total) by mouth 2 (two) times daily. 03/03/17   Cuthriell, Delorise RoyalsJonathan D, PA-C  fluticasone (FLONASE) 50 MCG/ACT nasal spray Place 1 spray into both nostrils 2 (two) times daily. 03/03/17   Cuthriell, Delorise RoyalsJonathan D, PA-C  HYDROcodone-acetaminophen (NORCO) 5-325 MG tablet Take 1 tablet by mouth every 4 (four) hours as needed for moderate pain. 11/21/17   Willy Eddyobinson, Briahna Pescador, MD  HYDROcodone-acetaminophen (NORCO/VICODIN) 5-325 MG tablet Take 1 tablet by mouth every 4 (four)  hours as needed for moderate pain. 12/04/16   Tommi RumpsSummers, Rhonda L, PA-C  magic mouthwash w/lidocaine SOLN Take 5 mLs by mouth 4 (four) times daily. 03/03/17   Cuthriell, Delorise RoyalsJonathan D, PA-C  predniSONE (DELTASONE) 10 MG tablet Take 1 tablet (10 mg total) by mouth daily. Day 1-2: Take 50 mg  ( 5 pills) Day 3-4 : Take 40 mg (4pills) Day 5-6: Take 30 mg (3 pills) Day 7-8:  Take 20 mg (2 pills) Day 9:  Take 10mg  (1 pill) 11/21/17   Willy Eddyobinson, Alayjah Boehringer, MD    Allergies Penicillins    Social History Social History   Tobacco Use  . Smoking status: Current Every Day Smoker    Packs/day: 1.00    Types: Cigarettes  . Smokeless tobacco: Never Used  Substance Use Topics  . Alcohol use: No  . Drug use: No    Review of Systems Patient denies headaches, rhinorrhea, blurry vision, numbness, shortness of breath, chest pain, edema, cough, abdominal pain, nausea, vomiting, diarrhea, dysuria, fevers, rashes or hallucinations unless otherwise stated above in HPI. ____________________________________________   PHYSICAL EXAM:  VITAL SIGNS: Vitals:   11/21/17 1958  BP: 126/79  Pulse: 98  Resp: 16  Temp: 98.2 F (36.8 C)  SpO2: 100%    Constitutional: Alert and oriented. Well appearing and in no acute distress. Eyes: Conjunctivae are normal.  Head: Atraumatic. Nose: No congestion/rhinnorhea. Mouth/Throat: Mucous membranes are moist.   Neck: Painless ROM.  Cardiovascular:   Good  peripheral circulation. Respiratory: Normal respiratory effort.  No retractions.  Gastrointestinal: Soft and nontender.  Musculoskeletal: No lower extremity tenderness .  No joint effusions. Neurologic:  Normal speech and language. No gross focal neurologic deficits are appreciated.  Skin:  Skin is warm, dry and intact.  Dermatomal linear erythematous eruption no vesicles but there is some raised lesions almost urticarial appearing most likely in the T1 distribution Psychiatric: Mood and affect are normal. Speech and  behavior are normal.  ____________________________________________   LABS (all labs ordered are listed, but only abnormal results are displayed)  No results found for this or any previous visit (from the past 24 hour(s)). ____________________________________________ ____________________________________________  RADIOLOGY  . ____________________________________________   PROCEDURES  Procedure(s) performed:  Procedures    Critical Care performed: no ____________________________________________   INITIAL IMPRESSION / ASSESSMENT AND PLAN / ED COURSE  Pertinent labs & imaging results that were available during my care of the patient were reviewed by me and considered in my medical decision making (see chart for details).  DDX: shingles, urticaria, cellulitis  Carolann LittlerMonica M Chio is a 36 y.o. who presents to the ED with shingles rash.  No complicating features.  Afebrile Heema dynamically stable.  Will be given acyclovir, steroids as well as pain medication.  [place] PMP reviewed.  Have discussed with the patient and available family all diagnostics and treatments performed thus far and all questions were answered to the best of my ability. The patient demonstrates understanding and agreement with plan.       ____________________________________________   FINAL CLINICAL IMPRESSION(S) / ED DIAGNOSES  Final diagnoses:  Herpes zoster without complication      NEW MEDICATIONS STARTED DURING THIS VISIT:  This SmartLink is deprecated. Use AVSMEDLIST instead to display the medication list for a patient.   Note:  This document was prepared using Dragon voice recognition software and may include unintentional dictation errors.     Willy Eddyobinson, Mayco Walrond, MD 11/21/17 2039

## 2017-11-30 ENCOUNTER — Emergency Department
Admission: EM | Admit: 2017-11-30 | Discharge: 2017-11-30 | Disposition: A | Discharge status: Home / Self Care | Payer: Self-pay | Attending: Student in an Organized Health Care Education/Training Program | Admitting: Student in an Organized Health Care Education/Training Program

## 2017-11-30 ENCOUNTER — Other Ambulatory Visit: Payer: Self-pay

## 2017-11-30 ENCOUNTER — Encounter: Payer: Self-pay | Admitting: Emergency Medicine

## 2017-11-30 DIAGNOSIS — F17210 Nicotine dependence, cigarettes, uncomplicated: Secondary | ICD-10-CM | POA: Insufficient documentation

## 2017-11-30 DIAGNOSIS — Z79899 Other long term (current) drug therapy: Secondary | ICD-10-CM | POA: Insufficient documentation

## 2017-11-30 DIAGNOSIS — B029 Zoster without complications: Secondary | ICD-10-CM | POA: Insufficient documentation

## 2017-11-30 DIAGNOSIS — J45909 Unspecified asthma, uncomplicated: Secondary | ICD-10-CM | POA: Insufficient documentation

## 2017-11-30 MED ORDER — VALACYCLOVIR HCL 1 G PO TABS: 1000.0000 mg | ORAL_TABLET | Freq: Two times a day (BID) | ORAL | 0 refills | Status: AC

## 2017-11-30 NOTE — ED Notes (Signed)
Increased rash and itching without improvement from medications

## 2017-11-30 NOTE — ED Provider Notes (Signed)
[facility] Emergency Department Provider Note  ____________________________________________  Time seen: Approximately 10:23 PM  I have reviewed the triage vital signs and the nursing notes.   HISTORY  Chief Complaint Herpes Zoster    HPI Leah Gates is a 36 y.o. female presents to the emergency department with shingles refractory to Valtrex.  Patient reports that she has been under tremendous stress and she feels that her shingles flare is worsening.  Patient has been taking Valtrex and steroids as directed.   Past Medical History:  Diagnosis Date  . Asthma   . Shingles     There are no active problems to display for this patient.   Past Surgical History:  Procedure Laterality Date  . DILATION AND CURETTAGE OF UTERUS      Prior to Admission medications   Medication Sig Start Date End Date Taking? Authorizing Provider  acyclovir (ZOVIRAX) 400 MG tablet Take 1 tablet (400 mg total) by mouth 5 (five) times daily for 10 days. 11/21/17 12/01/17  Willy Eddyobinson, Patrick, MD  cetirizine (ZYRTEC) 10 MG tablet Take 1 tablet (10 mg total) by mouth daily. 03/03/17   Cuthriell, Delorise RoyalsJonathan D, PA-C  doxycycline (VIBRA-TABS) 100 MG tablet Take 1 tablet (100 mg total) by mouth 2 (two) times daily. 03/03/17   Cuthriell, Delorise RoyalsJonathan D, PA-C  fluticasone (FLONASE) 50 MCG/ACT nasal spray Place 1 spray into both nostrils 2 (two) times daily. 03/03/17   Cuthriell, Delorise RoyalsJonathan D, PA-C  HYDROcodone-acetaminophen (NORCO) 5-325 MG tablet Take 1 tablet by mouth every 4 (four) hours as needed for moderate pain. 11/21/17   Willy Eddyobinson, Patrick, MD  HYDROcodone-acetaminophen (NORCO/VICODIN) 5-325 MG tablet Take 1 tablet by mouth every 4 (four) hours as needed for moderate pain. 12/04/16   Tommi RumpsSummers, Rhonda L, PA-C  magic mouthwash w/lidocaine SOLN Take 5 mLs by mouth 4 (four) times daily. 03/03/17   Cuthriell, Delorise RoyalsJonathan D, PA-C  predniSONE (DELTASONE) 10 MG tablet Take 1 tablet (10 mg total) by mouth  daily. Day 1-2: Take 50 mg  ( 5 pills) Day 3-4 : Take 40 mg (4pills) Day 5-6: Take 30 mg (3 pills) Day 7-8:  Take 20 mg (2 pills) Day 9:  Take 10mg  (1 pill) 11/21/17   Willy Eddyobinson, Patrick, MD  valACYclovir (VALTREX) 1000 MG tablet Take 1 tablet (1,000 mg total) by mouth 2 (two) times daily for 7 days. 11/30/17 12/07/17  Orvil FeilWoods, Jaclyn M, PA-C    Allergies Penicillins  No family history on file.  Social History Social History   Tobacco Use  . Smoking status: Current Every Day Smoker    Packs/day: 1.00    Types: Cigarettes  . Smokeless tobacco: Never Used  Substance Use Topics  . Alcohol use: No  . Drug use: No     Review of Systems  Constitutional: No fever/chills Eyes: No visual changes. No discharge ENT: No upper respiratory complaints. Cardiovascular: no chest pain. Respiratory: no cough. No SOB. Musculoskeletal: Negative for musculoskeletal pain. Skin: Patient has shingles. Neurological: Negative for headaches, focal weakness or numbness.   ____________________________________________   PHYSICAL EXAM:  VITAL SIGNS: ED Triage Vitals  Enc Vitals Group     BP 11/30/17 2126 111/82     Pulse Rate 11/30/17 2126 80     Resp 11/30/17 2126 16     Temp 11/30/17 2126 98.1 F (36.7 C)     Temp Source 11/30/17 2126 Oral     SpO2 11/30/17 2126 100 %     Weight 11/30/17 2127 120 lb (54.4 kg)  Height 11/30/17 2127 5\' 4"  (1.626 m)     Head Circumference --      Peak Flow --      Pain Score 11/30/17 2126 8     Pain Loc --      Pain Edu? --      Excl. in GC? --      Constitutional: Alert and oriented. Well appearing and in no acute distress. Eyes: Conjunctivae are normal. PERRL. EOMI. Head: Atraumatic.  Cardiovascular: Normal rate, regular rhythm. Normal S1 and S2.  Good peripheral circulation. Respiratory: Normal respiratory effort without tachypnea or retractions. Lungs CTAB. Good air entry to the bases with no decreased or absent breath sounds. Gastrointestinal:  Bowel sounds 4 quadrants. Soft and nontender to palpation. No guarding or rigidity. No palpable masses. No distention. No CVA tenderness. Musculoskeletal: Full range of motion to all extremities. No gross deformities appreciated. Neurologic:  Normal speech and language. No gross focal neurologic deficits are appreciated.  Skin: Patient has erythematous rash with vesicular formation in a dermatomal distribution along patient's left upper extremity, back and torso. Psychiatric: Mood and affect are normal. Speech and behavior are normal. Patient exhibits appropriate insight and judgement.   ____________________________________________   LABS (all labs ordered are listed, but only abnormal results are displayed)  Labs Reviewed - No data to display ____________________________________________  EKG   ____________________________________________  RADIOLOGY   No results found.  ____________________________________________    PROCEDURES  Procedure(s) performed:    Procedures    Medications - No data to display   ____________________________________________   INITIAL IMPRESSION / ASSESSMENT AND PLAN / ED COURSE  Pertinent labs & imaging results that were available during my care of the patient were reviewed by me and considered in my medical decision making (see chart for details).  Review of the [place] CSRS was performed in accordance of the [employer] prior to dispensing any controlled drugs.     Assessment and plan Shingles Patient presents to the emergency department with shingles refractory to Valtrex.  Patient reports that she thinks her shingles flare is worsening.  Patient was transitioned from acyclovir to Valtrex.  Patient education was provided regarding stress management, appropriate hydration and diet.  Patient was advised to follow-up with primary care as needed.  All patient questions were answered.     ____________________________________________  FINAL  CLINICAL IMPRESSION(S) / ED DIAGNOSES  Final diagnoses:  Herpes zoster without complication      NEW MEDICATIONS STARTED DURING THIS VISIT:  ED Discharge Orders        Ordered    valACYclovir (VALTREX) 1000 MG tablet  2 times daily     11/30/17 2218          This chart was dictated using voice recognition software/Dragon. Despite best efforts to proofread, errors can occur which can change the meaning. Any change was purely unintentional.    Orvil FeilWoods, Jaclyn M, PA-C 11/30/17 2226    Willy Eddyobinson, Patrick, MD 12/04/17 [phone]

## 2017-11-30 NOTE — ED Triage Notes (Signed)
Patient to ER via POV for increasingly spreading shingles rash. States she was recently diagnosed with shingles, medication has not been helping.

## 2017-12-10 ENCOUNTER — Emergency Department
Admission: EM | Admit: 2017-12-10 | Discharge: 2017-12-10 | Disposition: A | Discharge status: Home / Self Care | Payer: No Typology Code available for payment source | Attending: Emergency Medicine | Admitting: Emergency Medicine

## 2017-12-10 ENCOUNTER — Emergency Department: Payer: No Typology Code available for payment source

## 2017-12-10 ENCOUNTER — Encounter: Payer: Self-pay | Admitting: Emergency Medicine

## 2017-12-10 DIAGNOSIS — Y92410 Unspecified street and highway as the place of occurrence of the external cause: Secondary | ICD-10-CM | POA: Diagnosis not present

## 2017-12-10 DIAGNOSIS — M549 Dorsalgia, unspecified: Secondary | ICD-10-CM | POA: Diagnosis not present

## 2017-12-10 DIAGNOSIS — F17210 Nicotine dependence, cigarettes, uncomplicated: Secondary | ICD-10-CM | POA: Diagnosis not present

## 2017-12-10 DIAGNOSIS — Y9389 Activity, other specified: Secondary | ICD-10-CM | POA: Insufficient documentation

## 2017-12-10 DIAGNOSIS — Y998 Other external cause status: Secondary | ICD-10-CM | POA: Diagnosis not present

## 2017-12-10 DIAGNOSIS — Z79899 Other long term (current) drug therapy: Secondary | ICD-10-CM | POA: Insufficient documentation

## 2017-12-10 DIAGNOSIS — M25562 Pain in left knee: Secondary | ICD-10-CM | POA: Insufficient documentation

## 2017-12-10 DIAGNOSIS — J45909 Unspecified asthma, uncomplicated: Secondary | ICD-10-CM | POA: Diagnosis not present

## 2017-12-10 MED ORDER — CYCLOBENZAPRINE HCL 5 MG PO TABS: 5.0000 mg | ORAL_TABLET | Freq: Three times a day (TID) | ORAL | 0 refills | Status: AC | PRN

## 2017-12-10 MED ORDER — CYCLOBENZAPRINE HCL 10 MG PO TABS
10.0000 mg | ORAL_TABLET | Freq: Once | ORAL | Status: AC
  Administered 2017-12-10: 10 mg via ORAL

## 2017-12-10 MED ORDER — MELOXICAM 15 MG PO TABS: 15.0000 mg | ORAL_TABLET | Freq: Every day | ORAL | 1 refills | Status: AC

## 2017-12-10 MED ORDER — KETOROLAC TROMETHAMINE 30 MG/ML IJ SOLN
30.0000 mg | Freq: Once | INTRAMUSCULAR | Status: AC
  Administered 2017-12-10: 30 mg via INTRAMUSCULAR
  Filled 2017-12-10: qty 1

## 2017-12-10 MED ORDER — ORPHENADRINE CITRATE 30 MG/ML IJ SOLN: 60.0000 mg | Freq: Two times a day (BID) | INTRAMUSCULAR | Status: DC

## 2017-12-10 MED ORDER — CYCLOBENZAPRINE HCL 10 MG PO TABS
ORAL_TABLET | ORAL | Status: AC
  Administered 2017-12-10: 10 mg via ORAL
  Filled 2017-12-10: qty 1

## 2017-12-10 NOTE — ED Notes (Signed)
Patient transported to CT 
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
## 2019-10-09 ENCOUNTER — Other Ambulatory Visit: Payer: Self-pay

## 2019-10-09 ENCOUNTER — Emergency Department
Admission: EM | Admit: 2019-10-09 | Discharge: 2019-10-09 | Disposition: A | Discharge status: Home / Self Care | Payer: Self-pay | Attending: Emergency Medicine | Admitting: Emergency Medicine

## 2019-10-09 DIAGNOSIS — R600 Localized edema: Secondary | ICD-10-CM | POA: Insufficient documentation

## 2019-10-09 DIAGNOSIS — Z5321 Procedure and treatment not carried out due to patient leaving prior to being seen by health care provider: Secondary | ICD-10-CM | POA: Insufficient documentation

## 2019-10-09 LAB — URINALYSIS, COMPLETE (UACMP) WITH MICROSCOPIC
Bacteria, UA: NONE SEEN
Bilirubin Urine: NEGATIVE
Glucose, UA: NEGATIVE mg/dL
Hgb urine dipstick: NEGATIVE
Ketones, ur: NEGATIVE mg/dL
Leukocytes,Ua: NEGATIVE
Nitrite: NEGATIVE
Protein, ur: NEGATIVE mg/dL
Specific Gravity, Urine: 1.005 (ref 1.005–1.030)
pH: 6 (ref 5.0–8.0)

## 2019-10-09 LAB — CBC WITH DIFFERENTIAL/PLATELET
Abs Immature Granulocytes: 0.06 10*3/uL (ref 0.00–0.07)
Basophils Absolute: 0.1 10*3/uL (ref 0.0–0.1)
Basophils Relative: 0 %
Eosinophils Absolute: 0.1 10*3/uL (ref 0.0–0.5)
Eosinophils Relative: 0 %
HCT: 38.9 % (ref 36.0–46.0)
Hemoglobin: 13.1 g/dL (ref 12.0–15.0)
Immature Granulocytes: 0 %
Lymphocytes Relative: 15 %
Lymphs Abs: 2.5 10*3/uL (ref 0.7–4.0)
MCH: 33.6 pg (ref 26.0–34.0)
MCHC: 33.7 g/dL (ref 30.0–36.0)
MCV: 99.7 fL (ref 80.0–100.0)
Monocytes Absolute: 0.8 10*3/uL (ref 0.1–1.0)
Monocytes Relative: 5 %
Neutro Abs: 13.4 10*3/uL — ABNORMAL HIGH (ref 1.7–7.7)
Neutrophils Relative %: 80 %
Platelets: 396 10*3/uL (ref 150–400)
RBC: 3.9 MIL/uL (ref 3.87–5.11)
RDW: 12.2 % (ref 11.5–15.5)
WBC: 16.9 10*3/uL — ABNORMAL HIGH (ref 4.0–10.5)
nRBC: 0 % (ref 0.0–0.2)

## 2019-10-09 LAB — POCT PREGNANCY, URINE: Preg Test, Ur: NEGATIVE

## 2019-10-09 LAB — LACTIC ACID, PLASMA: Lactic Acid, Venous: 0.9 mmol/L (ref 0.5–1.9)

## 2019-10-09 LAB — COMPREHENSIVE METABOLIC PANEL
ALT: 11 U/L (ref 0–44)
AST: 15 U/L (ref 15–41)
Albumin: 4.3 g/dL (ref 3.5–5.0)
Alkaline Phosphatase: 81 U/L (ref 38–126)
Anion gap: 8 (ref 5–15)
BUN: 6 mg/dL (ref 6–20)
CO2: 26 mmol/L (ref 22–32)
Calcium: 9.2 mg/dL (ref 8.9–10.3)
Chloride: 105 mmol/L (ref 98–111)
Creatinine, Ser: 0.49 mg/dL (ref 0.44–1.00)
GFR calc Af Amer: >60 mL/min (ref >60)
GFR calc non Af Amer: >60 mL/min (ref >60)
Glucose, Bld: 121 mg/dL — ABNORMAL HIGH (ref 70–99)
Potassium: 3.9 mmol/L (ref 3.5–5.1)
Sodium: 139 mmol/L (ref 135–145)
Total Bilirubin: 0.4 mg/dL (ref 0.3–1.2)
Total Protein: 7.6 g/dL (ref 6.5–8.1)

## 2019-10-09 MED ORDER — SODIUM CHLORIDE 0.9% FLUSH: 3.0000 mL | Freq: Once | INTRAVENOUS | Status: DC

## 2019-10-09 NOTE — ED Notes (Signed)
Pt not answering when called in the [facility], will attempt again in 64min

## 2019-10-09 NOTE — ED Triage Notes (Signed)
Pt c/o severe left sided facial swelling that started under the left eye yesterday, today has swelling of the whole left side with lip and jaw. States it is painful, denies dental pain.

## 2019-10-09 NOTE — ED Notes (Signed)
Pt called in the [facility] with no response, will attempt again in 5 min 

## 2019-10-09 NOTE — ED Notes (Signed)
Pt called in the [place] with no response and is not visualized,.

## 2019-10-10 ENCOUNTER — Encounter: Payer: Self-pay | Admitting: Emergency Medicine

## 2019-10-10 ENCOUNTER — Emergency Department
Admission: EM | Admit: 2019-10-10 | Discharge: 2019-10-10 | Disposition: A | Discharge status: Home / Self Care | Payer: Self-pay | Attending: Emergency Medicine | Admitting: Emergency Medicine

## 2019-10-10 ENCOUNTER — Telehealth: Payer: Self-pay | Admitting: Emergency Medicine

## 2019-10-10 ENCOUNTER — Other Ambulatory Visit: Payer: Self-pay

## 2019-10-10 DIAGNOSIS — F17210 Nicotine dependence, cigarettes, uncomplicated: Secondary | ICD-10-CM | POA: Insufficient documentation

## 2019-10-10 DIAGNOSIS — K047 Periapical abscess without sinus: Secondary | ICD-10-CM | POA: Insufficient documentation

## 2019-10-10 DIAGNOSIS — J45909 Unspecified asthma, uncomplicated: Secondary | ICD-10-CM | POA: Insufficient documentation

## 2019-10-10 LAB — CBC WITH DIFFERENTIAL/PLATELET
Abs Immature Granulocytes: 0.05 10*3/uL (ref 0.00–0.07)
Basophils Absolute: 0.1 10*3/uL (ref 0.0–0.1)
Basophils Relative: 0 %
Eosinophils Absolute: 0.1 10*3/uL (ref 0.0–0.5)
Eosinophils Relative: 0 %
HCT: 34.8 % — ABNORMAL LOW (ref 36.0–46.0)
Hemoglobin: 11.7 g/dL — ABNORMAL LOW (ref 12.0–15.0)
Immature Granulocytes: 0 %
Lymphocytes Relative: 17 %
Lymphs Abs: 2.3 10*3/uL (ref 0.7–4.0)
MCH: 33.7 pg (ref 26.0–34.0)
MCHC: 33.6 g/dL (ref 30.0–36.0)
MCV: 100.3 fL — ABNORMAL HIGH (ref 80.0–100.0)
Monocytes Absolute: 0.9 10*3/uL (ref 0.1–1.0)
Monocytes Relative: 7 %
Neutro Abs: 9.9 10*3/uL — ABNORMAL HIGH (ref 1.7–7.7)
Neutrophils Relative %: 76 %
Platelets: 333 10*3/uL (ref 150–400)
RBC: 3.47 MIL/uL — ABNORMAL LOW (ref 3.87–5.11)
RDW: 12.1 % (ref 11.5–15.5)
WBC: 13.2 10*3/uL — ABNORMAL HIGH (ref 4.0–10.5)
nRBC: 0 % (ref 0.0–0.2)

## 2019-10-10 LAB — BASIC METABOLIC PANEL
Anion gap: 7 (ref 5–15)
BUN: <5 mg/dL — ABNORMAL LOW (ref 6–20)
CO2: 26 mmol/L (ref 22–32)
Calcium: 8.7 mg/dL — ABNORMAL LOW (ref 8.9–10.3)
Chloride: 105 mmol/L (ref 98–111)
Creatinine, Ser: 0.54 mg/dL (ref 0.44–1.00)
GFR calc Af Amer: >60 mL/min (ref >60)
GFR calc non Af Amer: >60 mL/min (ref >60)
Glucose, Bld: 105 mg/dL — ABNORMAL HIGH (ref 70–99)
Potassium: 4 mmol/L (ref 3.5–5.1)
Sodium: 138 mmol/L (ref 135–145)

## 2019-10-10 MED ORDER — SODIUM CHLORIDE 0.9 % IV SOLN
Freq: Once | INTRAVENOUS | Status: AC
  Administered 2019-10-10: 11:00:00 via INTRAVENOUS

## 2019-10-10 MED ORDER — CLINDAMYCIN PHOSPHATE 900 MG/50ML IV SOLN
900.0000 mg | Freq: Once | INTRAVENOUS | Status: AC
  Administered 2019-10-10: 900 mg via INTRAVENOUS
  Filled 2019-10-10: qty 50

## 2019-10-10 MED ORDER — CLINDAMYCIN HCL 300 MG PO CAPS: 300.0000 mg | ORAL_CAPSULE | Freq: Three times a day (TID) | ORAL | 0 refills | Status: DC

## 2019-10-10 MED ORDER — OXYCODONE-ACETAMINOPHEN 5-325 MG PO TABS: 1.0000 | ORAL_TABLET | Freq: Three times a day (TID) | ORAL | 0 refills | Status: AC | PRN

## 2019-10-10 MED ORDER — HYDROMORPHONE HCL 1 MG/ML IJ SOLN
0.5000 mg | Freq: Once | INTRAMUSCULAR | Status: AC
  Administered 2019-10-10: 11:00:00 0.5 mg via INTRAVENOUS
  Filled 2019-10-10: qty 1

## 2019-10-10 MED ORDER — DEXAMETHASONE SODIUM PHOSPHATE 10 MG/ML IJ SOLN
10.0000 mg | Freq: Once | INTRAMUSCULAR | Status: AC
  Administered 2019-10-10: 11:00:00 10 mg via INTRAVENOUS
  Filled 2019-10-10: qty 1

## 2019-10-10 NOTE — ED Provider Notes (Signed)
[facility] Emergency Department Provider Note       Time seen: ----------------------------------------- 10:46 AM on 10/10/2019 -----------------------------------------   I have reviewed the triage vital signs and the nursing notes.  HISTORY   Chief Complaint Facial Swelling    HPI Leah Gates is a 38 y.o. female with a history of asthma, shingles who presents to the ED for left-sided facial swelling and left jaw pain since Sunday.  Patient was here yesterday but had to leave prior to being seen.  Blood work did reveal that she had significant leukocytosis.  She is having severe left facial pain and swelling.  No difficulty breathing or swallowing.  Past Medical History:  Diagnosis Date  . Asthma   . Shingles     There are no active problems to display for this patient.   Past Surgical History:  Procedure Laterality Date  . DILATION AND CURETTAGE OF UTERUS      Allergies Penicillins  Social History Social History   Tobacco Use  . Smoking status: Current Every Day Smoker    Packs/day: 1.00    Types: Cigarettes  . Smokeless tobacco: Never Used  Substance Use Topics  . Alcohol use: No  . Drug use: No   Review of Systems Constitutional: Negative for fever. HEENT: Positive for left-sided facial swelling and pain Cardiovascular: Negative for chest pain. Respiratory: Negative for shortness of breath. Gastrointestinal: Negative for abdominal pain, vomiting and diarrhea. Musculoskeletal: Negative for back pain. Skin: Negative for rash. Neurological: Negative for headaches, focal weakness or numbness.  All systems negative/normal/unremarkable except as stated in the HPI  ____________________________________________   PHYSICAL EXAM:  VITAL SIGNS: ED Triage Vitals  Enc Vitals Group     BP 10/10/19 1024 118/90     Pulse Rate 10/10/19 1024 (!) 133     Resp 10/10/19 1024 19     Temp 10/10/19 1024 99.1 F (37.3 C)     Temp  Source 10/10/19 1024 Oral     SpO2 10/10/19 1024 98 %     Weight 10/10/19 1026 120 lb (54.4 kg)     Height 10/10/19 1026 5\' 4"  (1.626 m)     Head Circumference --      Peak Flow --      Pain Score 10/10/19 1030 8     Pain Loc --      Pain Edu? --      Excl. in GC? --     Constitutional: Alert and oriented.  No distress Eyes: Conjunctivae are normal. Normal extraocular movements. ENT      Head: Normocephalic, left facial swelling is noted.  Tenderness to percussion of the left maxillary sinus      Nose: No congestion/rhinnorhea.      Mouth/Throat: Multiple dental caries are noted, particular in the left upper jaw, there is tenderness to percussion over dental caries and teeth remnants particularly the left upper canine.      Neck: No stridor. Cardiovascular: Normal rate, regular rhythm. No murmurs, rubs, or gallops. Respiratory: Normal respiratory effort without tachypnea nor retractions. Breath sounds are clear and equal bilaterally. No wheezes/rales/rhonchi. Gastrointestinal: Soft and nontender. Normal bowel sounds Musculoskeletal: Nontender with normal range of motion in extremities. No lower extremity tenderness nor edema. Neurologic:  Normal speech and language. No gross focal neurologic deficits are appreciated.  Skin: Mild skin erythema is appreciated on the left face with edema Psychiatric: Mood and affect are normal. Speech and behavior are normal.  ____________________________________________  ED COURSE:  As part of my medical decision making, I reviewed the following data within the [facility] History obtained from family if available, nursing notes, old chart and ekg, as well as notes from prior ED visits. Patient presented for left-sided facial swelling and pain likely related to a dental abscess, given that she has had labs yesterday, I will give IV steroids, antibiotics and pain medicine.   Procedures  Leah Gates was evaluated in Emergency  Department on 10/10/2019 for the symptoms described in the history of present illness. She was evaluated in the context of the global COVID-19 pandemic, which necessitated consideration that the patient might be at risk for infection with the SARS-CoV-2 virus that causes COVID-19. Institutional protocols and algorithms that pertain to the evaluation of patients at risk for COVID-19 are in a state of rapid change based on information released by regulatory bodies including the CDC and federal and state organizations. These policies and algorithms were followed during the patient's care in the ED.  ____________________________________________   LABS (pertinent positives/negatives)  Labs Reviewed  CBC WITH DIFFERENTIAL/PLATELET - Abnormal; Notable for the following components:      Result Value   WBC 13.2 (*)    RBC 3.47 (*)    Hemoglobin 11.7 (*)    HCT 34.8 (*)    MCV 100.3 (*)    Neutro Abs 9.9 (*)    All other components within normal limits  BASIC METABOLIC PANEL - Abnormal; Notable for the following components:   Glucose, Bld 105 (*)    BUN <5 (*)    Calcium 8.7 (*)    All other components within normal limits   ____________________________________________   DIFFERENTIAL DIAGNOSIS   Dental abscess, facial cellulitis, allergic reaction  FINAL ASSESSMENT AND PLAN  Dental abscess   Plan: The patient had presented for left-sided facial swelling. Patient's labs did reveal leukocytosis although improving compared to yesterday.  She be discharged with oral antibiotics will be referred to dentistry for outpatient follow-up.Laurence Aly, MD    Note: This note was generated in part or whole with voice recognition software. Voice recognition is usually quite accurate but there are transcription errors that can and very often do occur. I apologize for any typographical errors that were not detected and corrected.     Earleen Newport, MD 10/10/19 1309

## 2019-10-10 NOTE — Telephone Encounter (Signed)
Called patient due to lwot to inquire about condition and follow up plans. 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  Says she will return as soon as she can today.

## 2019-10-10 NOTE — ED Triage Notes (Signed)
Pt in via POV, reports facial swelling and left jaw pain since Sunday.  Here yesterday but left prior to being seen; received call back today due to elevated WBC.  Swelling remains from left eye down into jaw.  NAD noted at this time.

## 2019-10-17 LAB — CULTURE, BLOOD (SINGLE): Culture: NO GROWTH

## 2020-03-07 ENCOUNTER — Telehealth: Payer: Self-pay | Admitting: Family Medicine

## 2020-03-07 NOTE — Telephone Encounter (Signed)
Returned phone call to patient. Patient requesting an appt for Nexplanon removal/reinsertion. Patient states "this needs to come out by the ninth." Informed patient of limited appts and that supervisor would be calling to schedule this appt. Also counseled patient that device can be effective for up to four years from insertion date. Contact number verified and patient info and request forwarded to K. Rudd, Solicitor. Tawny Hopping, RN

## 2020-03-07 NOTE — Telephone Encounter (Signed)
Patient wants Nexplanon removed and reinsertion.

## 2020-03-14 ENCOUNTER — Telehealth: Payer: Self-pay | Admitting: Family Medicine

## 2020-03-14 NOTE — Telephone Encounter (Signed)
PT. needs appt to replace her Nexplanon, please return call with appt.

## 2020-03-14 NOTE — Telephone Encounter (Signed)
Pt is returning a call about Nexplanaon replacement.

## 2020-03-20 ENCOUNTER — Telehealth: Payer: Self-pay | Admitting: Family Medicine

## 2020-03-20 NOTE — Telephone Encounter (Signed)
Pt . needsan appt to replace her Nexplanon

## 2020-03-21 NOTE — Telephone Encounter (Signed)
Nexplanon addressed in 03/20/20 phone call Richmond Campbell, RN

## 2020-03-21 NOTE — Telephone Encounter (Signed)
Addressed concern in 03/20/20 phone call Richmond Campbell, RN

## 2020-03-21 NOTE — Telephone Encounter (Signed)
TC with patient.  Co per Dr. Alvester Morin recommendations, Nexplanon is good for 4 years.  Patient verbalized understanding and instructed to call if any problems or concerns arise.Richmond Campbell, RN   Nexplanon inserted 03/2017 per [employer].

## 2020-03-25 ENCOUNTER — Ambulatory Visit: Payer: Self-pay

## 2020-04-01 ENCOUNTER — Encounter: Payer: Self-pay | Admitting: Family Medicine

## 2020-04-01 ENCOUNTER — Other Ambulatory Visit: Payer: Self-pay

## 2020-04-01 ENCOUNTER — Ambulatory Visit (LOCAL_COMMUNITY_HEALTH_CENTER): Payer: Self-pay | Admitting: Family Medicine

## 2020-04-01 VITALS — BP 105/63 | Ht 64.0 in | Wt 121.4 lb

## 2020-04-01 DIAGNOSIS — Z720 Tobacco use: Secondary | ICD-10-CM

## 2020-04-01 DIAGNOSIS — Z308 Encounter for other contraceptive management: Secondary | ICD-10-CM

## 2020-04-01 DIAGNOSIS — Z3046 Encounter for surveillance of implantable subdermal contraceptive: Secondary | ICD-10-CM

## 2020-04-01 DIAGNOSIS — Z30017 Encounter for initial prescription of implantable subdermal contraceptive: Secondary | ICD-10-CM

## 2020-04-01 NOTE — Progress Notes (Signed)
Contraception/Family Planning VISIT ENCOUNTER NOTE  Subjective:   Leah Gates is a 39 y.o.  female here for reproductive life counseling.  Desires effectiveness and long acting from [facility].  Reports she does not want a pregnancy in the next year. Denies abnormal vaginal bleeding, discharge, pelvic pain, problems with intercourse or other gynecologic concerns.     BMI is WNL Does not endorse risk factors for HCV Has only one partner, declines STI screening Pap due in 2023- add pap history to Epic, abstracted from [place].  Needs CBE but declines today. Reviewed history of maternal BC and need to start screening at 42  Uses tobacco regularly 1-2 PPD per patient.   Gynecologic History No LMP recorded. Patient has had an implant. Contraception: Nexplanon  Health Maintenance Due  Topic Date Due  . COVID-19 Vaccine (1) Never done  . TETANUS/TDAP  Never done    The following portions of the patient's history were reviewed and updated as appropriate: allergies, current medications, past family history, past medical history, past social history, past surgical history and problem list.  Review of Systems Pertinent items are noted in HPI.   Objective:  BP 105/63   Ht 5\' 4"  (1.626 m)   Wt 121 lb 6.4 oz (55.1 kg)   BMI 20.84 kg/m  Gen: well appearing, NAD HEENT: no scleral icterus CV: RR Lung: Normal WOB Ext: warm well perfused  PELVIC: Not indicated  Nexplanon Removal and Insertion  Patient identified, informed consent performed, consent signed.   Patient does understand that irregular bleeding is a very common side effect of this medication. She was advised to have backup contraception for one week after replacement of the implant. Patient deemed to meet WHO criteria for being reasonably certain she is not pregnant.  Appropriate time out taken. Nexplanon site identified. Area prepped in usual sterile fashon. 3 ml of 1% lidocaine with epinephrine was used to anesthetize the area at  the distal end of the implant. A small stab incision was made right beside the implant on the distal portion. The Nexplanon rod was grasped using hemostats and removed without difficulty. There was minimal blood loss. There were no complications.   Confirmed correct location of insertion site. The insertion site was identified 8-10 cm (3-4 inches) from the medial epicondyle of the humerus and 3-5 cm (1.25-2 inches) posterior to (below) the sulcus (groove) between the biceps and triceps muscles of the patient's right arm- this was the same locations from which the old device was removed. New Nexplanon removed from packaging, Device confirmed in needle, then inserted full length of needle and withdrawn per handbook instructions. Nexplanon was able to palpated in the patient's right arm; patient palpated the insert herself.  There was minimal blood loss. Patient insertion site covered with guaze and a pressure bandage to reduce any bruising. The patient tolerated the procedure well and was given post procedure instructions.     Assessment and Plan:   Contraception counseling: Reviewed all forms of birth control options in the tiered based approach. available including abstinence; over the counter/barrier methods; hormonal contraceptive medication including pill, patch, ring, injection,contraceptive implant; hormonal and nonhormonal IUDs; permanent sterilization options including vasectomy and the various tubal sterilization modalities. Risks, benefits, and typical effectiveness rates were reviewed.  Questions were answered.  Written information was also given to the patient to review.  Patient desires another Nexplanon, this was prescribed for patient. She will follow up in  20yr for surveillance.  She was told to call with  any further questions, or with any concerns about this method of contraception.  Emphasized use of condoms 100% of the time for STI prevention.  1. Nexplanon insertion Inserted in same  site  2. Nexplanon removal Performed easily  3. Encounter for other contraceptive management Reviewed options, likes Nexplanon and will get another today Reviewed preventative care CBE due and recommend contacting [facility] since she will be 39 and mother has BC   4. Tobacco use Counseled on 5 As   Please refer to After Visit Summary for other counseling recommendations.   Return in about 1 year (around 04/01/2021) for Yearly wellness exam.  Federico Flake, MD [facility] DEPARTMENT

## 2020-04-02 ENCOUNTER — Other Ambulatory Visit: Payer: Self-pay | Admitting: Family Medicine

## 2020-04-02 ENCOUNTER — Encounter: Payer: Self-pay | Admitting: Family Medicine

## 2020-04-02 DIAGNOSIS — Z720 Tobacco use: Secondary | ICD-10-CM | POA: Insufficient documentation

## 2020-04-11 MED ORDER — ETONOGESTREL 68 MG ~~LOC~~ IMPL
68.0000 mg | DRUG_IMPLANT | Freq: Once | SUBCUTANEOUS | Status: AC
  Administered 2020-04-01: 68 mg via SUBCUTANEOUS

## 2020-04-11 NOTE — Addendum Note (Signed)
Addended by: Jossie Ng on: 04/11/2020 12:32 PM   Modules accepted: Orders

## 2020-07-21 ENCOUNTER — Encounter: Payer: Self-pay | Admitting: Intensive Care

## 2020-07-21 ENCOUNTER — Emergency Department
Admission: EM | Admit: 2020-07-21 | Discharge: 2020-07-21 | Disposition: A | Discharge status: Home / Self Care | Payer: Self-pay | Attending: Emergency Medicine | Admitting: Emergency Medicine

## 2020-07-21 ENCOUNTER — Other Ambulatory Visit: Payer: Self-pay

## 2020-07-21 DIAGNOSIS — M79605 Pain in left leg: Secondary | ICD-10-CM | POA: Insufficient documentation

## 2020-07-21 DIAGNOSIS — Z5321 Procedure and treatment not carried out due to patient leaving prior to being seen by health care provider: Secondary | ICD-10-CM | POA: Insufficient documentation

## 2020-07-21 DIAGNOSIS — M545 Low back pain: Secondary | ICD-10-CM | POA: Insufficient documentation

## 2020-07-21 MED ORDER — OXYCODONE-ACETAMINOPHEN 5-325 MG PO TABS
1.0000 | ORAL_TABLET | ORAL | Status: DC | PRN
  Administered 2020-07-21: 1 via ORAL
  Filled 2020-07-21: qty 1

## 2020-07-21 NOTE — ED Notes (Signed)
Per Jonny Ruiz EDT, pt was called 4 x times over the last 30-45 mins with no answer, checked outside also.

## 2020-07-21 NOTE — ED Triage Notes (Signed)
Patient c/o lower back pain with a intermittent shooting pain down left leg that started X2 days ago. Denies injury. Denies urinary symptoms

## 2020-12-10 ENCOUNTER — Emergency Department
Admission: EM | Admit: 2020-12-10 | Discharge: 2020-12-10 | Disposition: A | Discharge status: Home / Self Care | Payer: Self-pay | Attending: Emergency Medicine | Admitting: Emergency Medicine

## 2020-12-10 ENCOUNTER — Other Ambulatory Visit: Payer: Self-pay

## 2020-12-10 ENCOUNTER — Encounter: Payer: Self-pay | Admitting: *Deleted

## 2020-12-10 ENCOUNTER — Emergency Department: Payer: Self-pay

## 2020-12-10 DIAGNOSIS — F17210 Nicotine dependence, cigarettes, uncomplicated: Secondary | ICD-10-CM | POA: Insufficient documentation

## 2020-12-10 DIAGNOSIS — H109 Unspecified conjunctivitis: Secondary | ICD-10-CM

## 2020-12-10 DIAGNOSIS — J40 Bronchitis, not specified as acute or chronic: Secondary | ICD-10-CM | POA: Insufficient documentation

## 2020-12-10 DIAGNOSIS — Z20822 Contact with and (suspected) exposure to covid-19: Secondary | ICD-10-CM | POA: Insufficient documentation

## 2020-12-10 DIAGNOSIS — H1089 Other conjunctivitis: Secondary | ICD-10-CM | POA: Insufficient documentation

## 2020-12-10 LAB — POC SARS CORONAVIRUS 2 AG -  ED: SARS Coronavirus 2 Ag: NEGATIVE

## 2020-12-10 MED ORDER — CIPROFLOXACIN HCL 0.3 % OP SOLN: 1.0000 [drp] | Freq: Four times a day (QID) | OPHTHALMIC | 0 refills | Status: AC

## 2020-12-10 MED ORDER — BENZONATATE 100 MG PO CAPS
100.0000 mg | ORAL_CAPSULE | Freq: Once | ORAL | Status: AC
  Administered 2020-12-10: 100 mg via ORAL
  Filled 2020-12-10: qty 1

## 2020-12-10 MED ORDER — AZITHROMYCIN 250 MG PO TABS: ORAL_TABLET | ORAL | 0 refills | Status: AC

## 2020-12-10 MED ORDER — BENZONATATE 100 MG PO CAPS: 100.0000 mg | ORAL_CAPSULE | Freq: Three times a day (TID) | ORAL | 0 refills | Status: DC | PRN

## 2020-12-10 MED ORDER — ALBUTEROL SULFATE HFA 108 (90 BASE) MCG/ACT IN AERS: 2.0000 | INHALATION_SPRAY | Freq: Four times a day (QID) | RESPIRATORY_TRACT | 0 refills | Status: DC | PRN

## 2020-12-10 NOTE — ED Notes (Signed)
X-ray at bedside

## 2020-12-10 NOTE — ED Triage Notes (Signed)
Pt reports a cough for 1 week.  Pt has done home test which are Negative for covid.  Pt has drainage from both eyes.  Pt alert  Speech clear.

## 2020-12-10 NOTE — Discharge Instructions (Addendum)
You have been prescribed antibiotic eyedrops to use in both eyes, 4 times daily for the next 5 days.  Please do not wear your contacts during that time and do not reuse that pair of contacts.  You have also been prescribed a Z-Pak and an albuterol inhaler for your acute bronchitis.  Please [facility] with [facility] as needed, or return to the emergency department for any worsening.

## 2020-12-11 NOTE — ED Provider Notes (Signed)
[facility] Emergency Department Provider Note  ____________________________________________   Event Date/Time   First MD Initiated Contact with Patient 12/10/20 2147     (approximate)  I have reviewed the triage vital signs and the nursing notes.   HISTORY  Chief Complaint Cough  HPI Leah Gates is a 40 y.o. female who presents to the emergency department for evaluation of cough that has been present for the last 8 days.  She reports doing 4-5 home test over the course of the last 8 days her Covid and all have been negative.  She states that she has had a similar episode in the past when she was diagnosed with bronchitis and required antibiotics and inhaler for improvement.  She does have known history of asthma but states that it only worsens when she gets similar illnesses.  She is a daily smoker but has no diagnosis of COPD.  She denies any shortness of breath at this time.  She denies any known fever, but states that she has having "hot flashes" throughout the day.  In addition, the patient has a complaint of bilateral eye redness that is been present since yesterday.  The patient is a contact lens wearer and recently put in a new pair of contacts.  She associates this with some drainage which she describes as purulent that is worse in the mornings and does mildly drain throughout the day.  She has no history of similar eye complaint.           Past Medical History:  Diagnosis Date  . Asthma   . Shingles     Patient Active Problem List   Diagnosis Date Noted  . Tobacco use 04/02/2020    Past Surgical History:  Procedure Laterality Date  . DILATION AND CURETTAGE OF UTERUS      Prior to Admission medications   Medication Sig Start Date End Date Taking? Authorizing Provider  albuterol (VENTOLIN HFA) 108 (90 Base) MCG/ACT inhaler Inhale 2 puffs into the lungs every 6 (six) hours as needed for wheezing or shortness of breath. 12/10/20  Yes Marlana Salvage, PA  azithromycin (ZITHROMAX Z-PAK) 250 MG tablet Take 2 tablets (500 mg) on  Day 1,  followed by 1 tablet (250 mg) once daily on Days 2 through 5. 12/10/20 12/15/20 Yes Afsana Liera, Farrel Gordon, PA  benzonatate (TESSALON PERLES) 100 MG capsule Take 1 capsule (100 mg total) by mouth 3 (three) times daily as needed for cough. 12/10/20 12/10/21 Yes Kayleanna Lorman, Farrel Gordon, PA  ciprofloxacin (CILOXAN) 0.3 % ophthalmic solution Place 1 drop into both eyes in the morning, at noon, in the evening, and at bedtime for 5 days. Administer 1 drop, every 2 hours, while awake, for 2 days. Then 1 drop, every 4 hours, while awake, for the next 5 days. 12/10/20 12/15/20 Yes Geetika Laborde, Farrel Gordon, PA  clindamycin (CLEOCIN) 300 MG capsule Take 1 capsule (300 mg total) by mouth 3 (three) times daily. Patient not taking: Reported on 04/01/2020 10/10/19   Earleen Newport, MD  oxyCODONE-acetaminophen (PERCOCET) 5-325 MG tablet Take 1 tablet by mouth every 8 (eight) hours as needed. Patient not taking: Reported on 04/01/2020 10/10/19   Earleen Newport, MD    Allergies Penicillins  Family History  Problem Relation Age of Onset  . Hyperlipidemia Mother   . Breast cancer Mother   . Diabetes Maternal Grandfather   . Stroke Maternal Grandfather   . Cancer Maternal Grandfather  bladder  . Heart attack Maternal Grandfather   . Stroke Paternal Grandmother     Social History Social History   Tobacco Use  . Smoking status: Current Every Day Smoker    Packs/day: 1.00    Years: 27.00    Pack years: 27.00    Types: Cigarettes  . Smokeless tobacco: Never Used  Vaping Use  . Vaping Use: Never used  Substance Use Topics  . Alcohol use: No  . Drug use: No    Review of Systems Constitutional: No fever/chills Eyes: + Bilateral eye redness, no visual changes. ENT: No sore throat. Cardiovascular: Denies chest pain. Respiratory: + Cough, denies shortness of breath. Gastrointestinal: No abdominal pain.  No nausea, no  vomiting.  No diarrhea.  No constipation. Genitourinary: Negative for dysuria. Musculoskeletal: Negative for back pain. Skin: Negative for rash. Neurological: Negative for headaches, focal weakness or numbness.  ____________________________________________   PHYSICAL EXAM:  VITAL SIGNS: ED Triage Vitals  Enc Vitals Group     BP 12/10/20 1918 120/85     Pulse Rate 12/10/20 1918 100     Resp 12/10/20 1918 18     Temp 12/10/20 1918 98.2 F (36.8 C)     Temp Source 12/10/20 1918 Oral     SpO2 12/10/20 1918 98 %     Weight 12/10/20 1919 120 lb (54.4 kg)     Height 12/10/20 1919 5\' 4"  (1.626 m)     Head Circumference --      Peak Flow --      Pain Score 12/10/20 1919 7     Pain Loc --      Pain Edu? --      Excl. in GC? --     Constitutional: Alert and oriented. Well appearing and in no acute distress. Eyes: Conjunctivae are moderately injected bilaterally, left worse than right there is mild clear drainage noted from the left eye, no current drainage from the right. PERRL. EOMI. Head: Atraumatic. Nose: No congestion/rhinnorhea. Mouth/Throat: Mucous membranes are moist.  Oropharynx non-erythematous. Neck: No stridor.   Cardiovascular: Normal rate, regular rhythm. Grossly normal heart sounds.  Good peripheral circulation. Respiratory: Normal respiratory effort.  No retractions. Lungs with coarse breath sounds throughout without any wheezes, rales or rhonchi. Gastrointestinal: Soft and nontender. No distention. No abdominal bruits. No CVA tenderness. Musculoskeletal: No lower extremity tenderness nor edema.  No joint effusions. Neurologic:  Normal speech and language. No gross focal neurologic deficits are appreciated. No gait instability. Skin:  Skin is warm, dry and intact. No rash noted. Psychiatric: Mood and affect are normal. Speech and behavior are normal.  ____________________________________________   LABS (all labs ordered are listed, but only abnormal results are  displayed)  Labs Reviewed  POC SARS CORONAVIRUS 2 AG -  ED   ____________________________________________  RADIOLOGY 02/07/21, personally viewed and evaluated these images (plain radiographs) as part of my medical decision making, as well as reviewing the written report by the radiologist.  ED provider interpretation: Increased infrahilar lung markings bilaterally consistent with early bronchitis.  No focal pneumonia   Official radiology report(s): DG Chest Portable 1 View  Result Date: 12/10/2020 CLINICAL DATA:  Cough x1 week. EXAM: PORTABLE CHEST 1 VIEW COMPARISON:  None. FINDINGS: Mildly increased bilateral infrahilar bronchovascular lung markings are seen without evidence of acute infiltrate, pleural effusion or pneumothorax. The heart size and mediastinal contours are within normal limits. The visualized skeletal structures are unremarkable. IMPRESSION: Mildly increased bilateral infrahilar bronchovascular lung markings which may  represent mild bronchitis. Electronically Signed   By: Aram Candela M.D.   On: 12/10/2020 22:37    ____________________________________________   INITIAL IMPRESSION / ASSESSMENT AND PLAN / ED COURSE  As part of my medical decision making, I reviewed the following data within the electronic MEDICAL RECORD NUMBER Nursing notes reviewed and incorporated, Labs reviewed, Radiograph reviewed and Notes from prior ED visits        Patient is a 40 year old female who presents to the emergency department with multiple complaints.  See HPI for further details.  Vitals are within normal limits throughout the course of the patient's stay in the emergency room.  On physical exam, the patient does have injected conjunctive a bilaterally with some mild clear drainage noted from the left eye.  The patient does have coarse breath sounds throughout, with no focal findings on auscultation.  On laboratory evaluation, the patient's antigen test for Covid is negative.   Chest x-ray was obtained which does demonstrate some increase in the perihilar markings consistent with bronchitis.  While the patient does have a history of asthma, I do raise suspicion for possible early COPD given her smoking status as well as seeing that these have not cleared in the past without antibiotic coverage.  Given this, will initiate patient on azithromycin and treat her cough symptomatically with Tessalon Perles.  We will also refill her inhaler which she states that she is out of.  The patient will also be prescribed antibiotic eyedrops for coverage of Pseudomonas infection given that she is a contact lens wearer.  The patient is amenable with this plan, she will follow up with primary care or return to the emergency department with any worsening.      ____________________________________________   FINAL CLINICAL IMPRESSION(S) / ED DIAGNOSES  Final diagnoses:  Bronchitis  Bacterial conjunctivitis of both eyes     ED Discharge Orders         Ordered    ciprofloxacin (CILOXAN) 0.3 % ophthalmic solution  4 times daily        12/10/20 2303    albuterol (VENTOLIN HFA) 108 (90 Base) MCG/ACT inhaler  Every 6 hours PRN        12/10/20 2303    azithromycin (ZITHROMAX Z-PAK) 250 MG tablet        12/10/20 2303    benzonatate (TESSALON PERLES) 100 MG capsule  3 times daily PRN        12/10/20 2303          *Please note:  Leah Gates was evaluated in Emergency Department on 12/11/2020 for the symptoms described in the history of present illness. She was evaluated in the context of the global COVID-19 pandemic, which necessitated consideration that the patient might be at risk for infection with the SARS-CoV-2 virus that causes COVID-19. Institutional protocols and algorithms that pertain to the evaluation of patients at risk for COVID-19 are in a state of rapid change based on information released by regulatory bodies including the CDC and federal and state organizations. These  policies and algorithms were followed during the patient's care in the ED.  Some ED evaluations and interventions may be delayed as a result of limited staffing during and the pandemic.*   Note:  This document was prepared using Dragon voice recognition software and may include unintentional dictation errors.\    Lucy Chris, PA 12/11/20 1539    Arnaldo Natal, MD 12/11/20 2245

## 2021-03-13 ENCOUNTER — Telehealth: Payer: Self-pay

## 2021-03-13 NOTE — Telephone Encounter (Signed)
After receiving an ER referral, called pt. She is interested in becoming a pt, but was unable to discuss due to a prior obligation when I called. She has our number and will call back. If she has not called, please call her back on Tuesday, 4/12. Thank you.   MD 03/13/21 @ 3:15 pm

## 2021-10-07 ENCOUNTER — Ambulatory Visit
Admission: EM | Admit: 2021-10-07 | Discharge: 2021-10-07 | Disposition: A | Discharge status: Home / Self Care | Payer: No Typology Code available for payment source

## 2021-10-07 ENCOUNTER — Other Ambulatory Visit: Payer: Self-pay

## 2021-10-07 DIAGNOSIS — J209 Acute bronchitis, unspecified: Secondary | ICD-10-CM | POA: Diagnosis not present

## 2021-10-07 HISTORY — DX: Nicotine dependence, unspecified, uncomplicated: F17.200

## 2021-10-07 MED ORDER — PREDNISONE 10 MG (21) PO TBPK: ORAL_TABLET | Freq: Every day | ORAL | 0 refills | Status: DC

## 2021-10-07 MED ORDER — AZITHROMYCIN 250 MG PO TABS: 250.0000 mg | ORAL_TABLET | Freq: Every day | ORAL | 0 refills | Status: DC

## 2021-10-07 MED ORDER — ALBUTEROL SULFATE HFA 108 (90 BASE) MCG/ACT IN AERS: 1.0000 | INHALATION_SPRAY | Freq: Four times a day (QID) | RESPIRATORY_TRACT | 0 refills | Status: DC | PRN

## 2021-10-07 NOTE — ED Provider Notes (Signed)
Leah Gates    CSN: [id number] Arrival date & time: 10/07/21  1635      History   Chief Complaint Chief Complaint  Patient presents with   Cough   Nasal Congestion    HPI Leah Gates is a 40 y.o. female.  Patient presents with 4 to 5-day history of cough, congestion, shortness of breath.  Her cough is productive of green sputum and worse at night.  She denies fever, chills, or other symptoms.  Treatment attempted at home with OTC sinus medication.  Her medical history includes asthma.  Current every day smoker.  The history is provided by the patient and medical records.   Past Medical History:  Diagnosis Date   Asthma    Shingles    Smoker     Patient Active Problem List   Diagnosis Date Noted   Tobacco use 04/02/2020    Past Surgical History:  Procedure Laterality Date   DILATION AND CURETTAGE OF UTERUS      OB History   No obstetric history on file.      Home Medications    Prior to Admission medications   Medication Sig Start Date End Date Taking? Authorizing Provider  albuterol (VENTOLIN HFA) 108 (90 Base) MCG/ACT inhaler Inhale 1-2 puffs into the lungs every 6 (six) hours as needed for wheezing or shortness of breath. 10/07/21  Yes Mickie Bail, NP  azithromycin (ZITHROMAX) 250 MG tablet Take 1 tablet (250 mg total) by mouth daily. Take first 2 tablets together, then 1 every day until finished. 10/07/21  Yes Mickie Bail, NP  etonogestrel (NEXPLANON) 68 MG IMPL implant 1 each by Subdermal route once.   Yes [provider]  predniSONE (STERAPRED UNI-PAK 21 TAB) 10 MG (21) TBPK tablet Take by mouth daily. As directed 10/07/21  Yes Mickie Bail, NP  oxyCODONE-acetaminophen (PERCOCET) 5-325 MG tablet Take 1 tablet by mouth every 8 (eight) hours as needed. Patient not taking: Reported on 04/01/2020 10/10/19   Emily Filbert, MD    Family History Family History  Problem Relation Age of Onset   Hyperlipidemia Mother    Breast  cancer Mother    Diabetes Maternal Grandfather    Stroke Maternal Grandfather    Cancer Maternal Grandfather        bladder   Heart attack Maternal Grandfather    Stroke Paternal Grandmother     Social History Social History   Tobacco Use   Smoking status: Every Day    Packs/day: 1.00    Years: 27.00    Pack years: 27.00    Types: Cigarettes   Smokeless tobacco: Never  Vaping Use   Vaping Use: Never used  Substance Use Topics   Alcohol use: No   Drug use: No     Allergies   Penicillins   Review of Systems Review of Systems  Constitutional:  Negative for chills and fever.  HENT:  Positive for congestion. Negative for ear pain and sore throat.   Respiratory:  Positive for cough and shortness of breath.   Cardiovascular:  Negative for chest pain and palpitations.  Gastrointestinal:  Negative for diarrhea and vomiting.  Skin:  Negative for color change and rash.  All other systems reviewed and are negative.   Physical Exam Triage Vital Signs ED Triage Vitals [10/07/21 1822]  Enc Vitals Group     BP 123/82     Pulse Rate 83     Resp 18     Temp  97.8 F (36.6 C)     Temp Source Oral     SpO2 94 %     Weight      Height      Head Circumference      Peak Flow      Pain Score      Pain Loc      Pain Edu?      Excl. in GC?    No data found.  Updated Vital Signs BP 123/82 (BP Location: Left Arm)   Pulse 83   Temp 97.8 F (36.6 C) (Oral)   Resp 18   SpO2 94%   Visual Acuity Right Eye Distance:   Left Eye Distance:   Bilateral Distance:    Right Eye Near:   Left Eye Near:    Bilateral Near:     Physical Exam Vitals and nursing note reviewed.  Constitutional:      General: She is not in acute distress.    Appearance: She is well-developed.  HENT:     Head: Normocephalic and atraumatic.     Right Ear: Tympanic membrane normal.     Left Ear: Tympanic membrane normal.     Nose: Nose normal.     Mouth/Throat:     Mouth: Mucous membranes are  moist.     Pharynx: Oropharynx is clear.  Eyes:     Conjunctiva/sclera: Conjunctivae normal.  Cardiovascular:     Rate and Rhythm: Normal rate and regular rhythm.     Heart sounds: Normal heart sounds.  Pulmonary:     Effort: Pulmonary effort is normal. No respiratory distress.     Breath sounds: Normal breath sounds. No wheezing.  Abdominal:     Palpations: Abdomen is soft.     Tenderness: There is no abdominal tenderness.  Musculoskeletal:     Cervical back: Neck supple.  Skin:    General: Skin is warm and dry.  Neurological:     Mental Status: She is alert.  Psychiatric:        Mood and Affect: Mood normal.        Behavior: Behavior normal.     UC Treatments / Results  Labs (all labs ordered are listed, but only abnormal results are displayed) Labs Reviewed - No data to display  EKG   Radiology No results found.  Procedures Procedures (including critical care time)  Medications Ordered in UC Medications - No data to display  Initial Impression / Assessment and Plan / UC Course  I have reviewed the triage vital signs and the nursing notes.  Pertinent labs & imaging results that were available during my care of the patient were reviewed by me and considered in my medical decision making (see chart for details).  Acute bronchitis.  Patient declines COVID or flu testing.  Treating with albuterol inhaler, prednisone, Zithromax.  Education provided on bronchitis.  Instructed patient to follow-up with her PCP if her symptoms are not improving.  ED precautions discussed.  Patient agrees to plan of care.   Final Clinical Impressions(s) / UC Diagnoses   Final diagnoses:  Acute bronchitis, unspecified organism     Discharge Instructions      Use the albuterol inhaler as directed.  Take the prednisone and Zithromax as directed.  Follow up with your primary care provider if your symptoms are not improving.    Go to the emergency department if you have acute  shortness of breath or other concerning symptoms.     ED Prescriptions  Medication Sig Dispense Auth. Provider   predniSONE (STERAPRED UNI-PAK 21 TAB) 10 MG (21) TBPK tablet Take by mouth daily. As directed 21 tablet Mickie Bail, NP   albuterol (VENTOLIN HFA) 108 (90 Base) MCG/ACT inhaler Inhale 1-2 puffs into the lungs every 6 (six) hours as needed for wheezing or shortness of breath. 18 g Mickie Bail, NP   azithromycin (ZITHROMAX) 250 MG tablet Take 1 tablet (250 mg total) by mouth daily. Take first 2 tablets together, then 1 every day until finished. 6 tablet Mickie Bail, NP      PDMP not reviewed this encounter.   Mickie Bail, NP 10/07/21 [phone]

## 2021-10-07 NOTE — ED Triage Notes (Signed)
Pt reports dry cough/unproductive since Thursday. Pt reports nasal s/s since Sat, stuffy nose and drainage, greenish in color. Thinks she may have sinus infection. Denies fever.  Pt is a smoker  Home COVID test negative yesterday

## 2021-10-07 NOTE — Discharge Instructions (Addendum)
Use the albuterol inhaler as directed.  Take the prednisone and Zithromax as directed.  Follow up with your primary care provider if your symptoms are not improving.    Go to the emergency department if you have acute shortness of breath or other concerning symptoms.

## 2022-02-20 IMAGING — DX DG CHEST 1V PORT
1 series · 1 of 1 positions shown · non-contrast
Comparison: None.

CLINICAL DATA: Cough x1 week.

EXAM:
PORTABLE CHEST 1 VIEW

[chest ap]
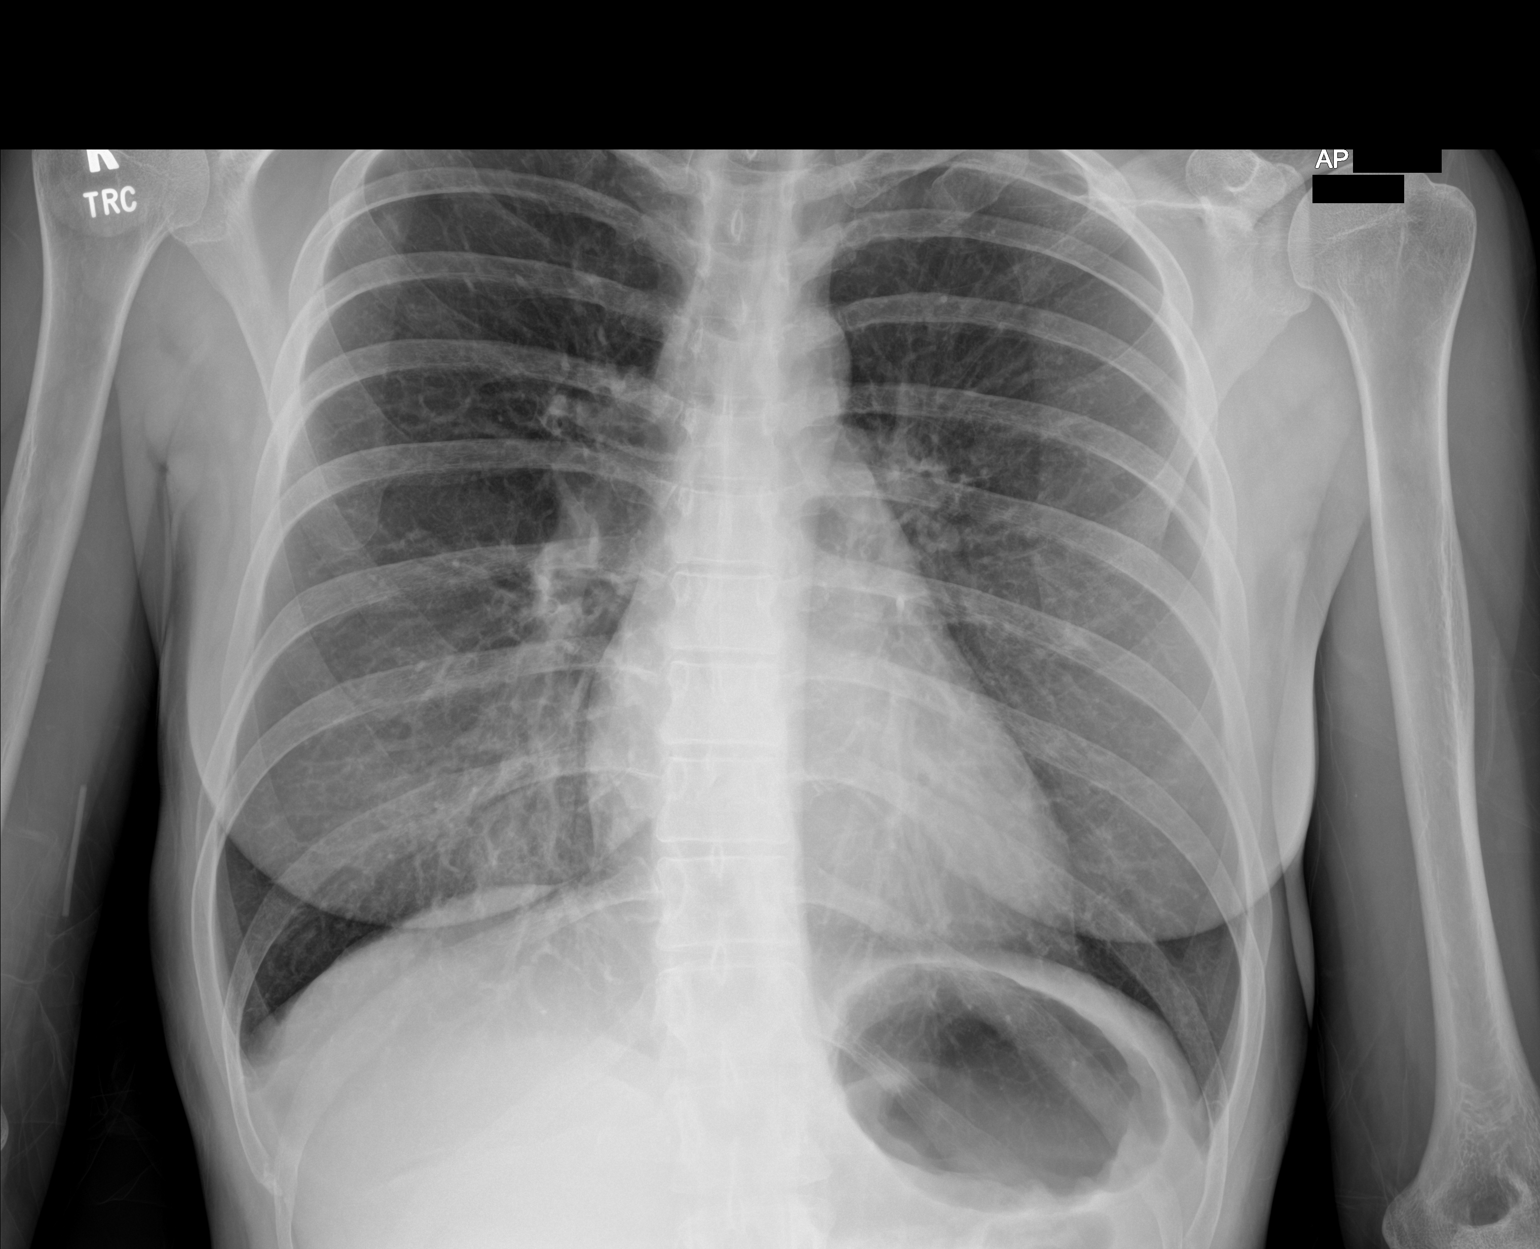

[1 of 1 positions shown; findings below may reference images not displayed]

FINDINGS: Mildly increased bilateral infrahilar bronchovascular lung markings
are seen without evidence of acute infiltrate, pleural effusion or
pneumothorax. The heart size and mediastinal contours are within
normal limits. The visualized skeletal structures are unremarkable.
IMPRESSION: Mildly increased bilateral infrahilar bronchovascular lung markings
which may represent mild bronchitis.

## 2022-11-20 ENCOUNTER — Ambulatory Visit
Admission: EM | Admit: 2022-11-20 | Discharge: 2022-11-20 | Disposition: A | Discharge status: Home / Self Care | Payer: BLUE CROSS/BLUE SHIELD | Attending: Emergency Medicine | Admitting: Emergency Medicine

## 2022-11-20 DIAGNOSIS — R220 Localized swelling, mass and lump, head: Secondary | ICD-10-CM

## 2022-11-20 DIAGNOSIS — K047 Periapical abscess without sinus: Secondary | ICD-10-CM | POA: Diagnosis not present

## 2022-11-20 MED ORDER — CLINDAMYCIN HCL 300 MG PO CAPS: 300.0000 mg | ORAL_CAPSULE | Freq: Three times a day (TID) | ORAL | 0 refills | Status: DC

## 2022-11-20 NOTE — Discharge Instructions (Addendum)
Take the antibiotic as prescribed.    A dental resource guide is attached.  Please call to make an appointment with a dentist as soon as possible.    Go to the emergency department if you have acute worsening symptoms.     

## 2022-11-20 NOTE — ED Triage Notes (Signed)
Patient to Urgent Care with complaints of top molar pain, reports the tooth is broken and she started having pain and swelling last week. Reports pain increased yesterday and she woke up this morning with the right side of her face was swollen. Reports she can feel so drainage down her throat.   Dentist appointment next Friday.

## 2022-11-20 NOTE — ED Provider Notes (Signed)
[name]    CSN: [id number] Arrival date & time: 11/20/22  7253      History   Chief Complaint Chief Complaint  Patient presents with   Dental Problem    HPI Leah Gates is a 41 y.o. female.  Patient presents with dental pain in right upper molar and right side facial swelling x 1 week.  The facial swelling is worse today.  She has an appointment to see her dentist next week.  No fever, chills, difficulty swallowing, shortness of breath, or other symptoms.  Treatment at home with Tylenol and ibuprofen.  Current everyday smoker.  The history is provided by the patient and medical records.    Past Medical History:  Diagnosis Date   Asthma    Shingles    Smoker     Patient Active Problem List   Diagnosis Date Noted   Tobacco use 04/02/2020    Past Surgical History:  Procedure Laterality Date   DILATION AND CURETTAGE OF UTERUS      OB History   No obstetric history on file.      Home Medications    Prior to Admission medications   Medication Sig Start Date End Date Taking? Authorizing Provider  clindamycin (CLEOCIN) 300 MG capsule Take 1 capsule (300 mg total) by mouth 3 (three) times daily. 11/20/22  Yes Mickie Bail, NP  albuterol (VENTOLIN HFA) 108 (90 Base) MCG/ACT inhaler Inhale 1-2 puffs into the lungs every 6 (six) hours as needed for wheezing or shortness of breath. 10/07/21   Mickie Bail, NP  etonogestrel (NEXPLANON) 68 MG IMPL implant 1 each by Subdermal route once.    [provider]  oxyCODONE-acetaminophen (PERCOCET) 5-325 MG tablet Take 1 tablet by mouth every 8 (eight) hours as needed. Patient not taking: Reported on 04/01/2020 10/10/19   Emily Filbert, MD    Family History Family History  Problem Relation Age of Onset   Hyperlipidemia Mother    Breast cancer Mother    Diabetes Maternal Grandfather    Stroke Maternal Grandfather    Cancer Maternal Grandfather        bladder   Heart attack Maternal  Grandfather    Stroke Paternal Grandmother     Social History Social History   Tobacco Use   Smoking status: Every Day    Packs/day: 1.00    Years: 27.00    Total pack years: 27.00    Types: Cigarettes   Smokeless tobacco: Never  Vaping Use   Vaping Use: Never used  Substance Use Topics   Alcohol use: No   Drug use: No     Allergies   Penicillins   Review of Systems Review of Systems  Constitutional:  Negative for chills and fever.  HENT:  Positive for dental problem and facial swelling. Negative for ear pain, sore throat, trouble swallowing and voice change.   Respiratory:  Negative for cough and shortness of breath.   Cardiovascular:  Negative for chest pain and palpitations.  Skin:  Negative for color change and rash.  All other systems reviewed and are negative.    Physical Exam Triage Vital Signs ED Triage Vitals  Enc Vitals Group     BP 11/20/22 1046 125/83     Pulse Rate 11/20/22 1039 100     Resp 11/20/22 1039 18     Temp 11/20/22 1039 98.5 F (36.9 C)     Temp src --      SpO2 11/20/22 1039 95 %  Weight 11/20/22 1045 150 lb (68 kg)     Height 11/20/22 1045 5\' 4"  (1.626 m)     Head Circumference --      Peak Flow --      Pain Score 11/20/22 1043 10     Pain Loc --      Pain Edu? --      Excl. in GC? --    No data found.  Updated Vital Signs BP 125/83   Pulse 100   Temp 98.5 F (36.9 C)   Resp 18   Ht 5\' 4"  (1.626 m)   Wt 150 lb (68 kg)   SpO2 95%   BMI 25.75 kg/m   Visual Acuity Right Eye Distance:   Left Eye Distance:   Bilateral Distance:    Right Eye Near:   Left Eye Near:    Bilateral Near:     Physical Exam Vitals and nursing note reviewed.  Constitutional:      General: She is not in acute distress.    Appearance: She is well-developed. She is not ill-appearing.  HENT:     Head:     Jaw: Swelling present.     Comments: Moderate right facial swelling and tenderness.    Mouth/Throat:     Mouth: Mucous membranes  are moist.     Dentition: Abnormal dentition. Dental tenderness and dental caries present.     Pharynx: Oropharynx is clear.     Comments: Right upper rear molar broken and carious.  Teeth in poor repair.  Several missing teeth.  Voice clear.  No difficulty swallowing. Cardiovascular:     Rate and Rhythm: Normal rate and regular rhythm.     Heart sounds: Normal heart sounds.  Pulmonary:     Effort: Pulmonary effort is normal. No respiratory distress.     Breath sounds: Normal breath sounds.  Musculoskeletal:     Cervical back: Neck supple.  Skin:    General: Skin is warm and dry.  Neurological:     Mental Status: She is alert.  Psychiatric:        Mood and Affect: Mood normal.        Behavior: Behavior normal.      UC Treatments / Results  Labs (all labs ordered are listed, but only abnormal results are displayed) Labs Reviewed - No data to display  EKG   Radiology No results found.  Procedures Procedures (including critical care time)  Medications Ordered in UC Medications - No data to display  Initial Impression / Assessment and Plan / UC Course  I have reviewed the triage vital signs and the nursing notes.  Pertinent labs & imaging results that were available during my care of the patient were reviewed by me and considered in my medical decision making (see chart for details).   Facial swelling, dental infection.  Patient has no difficulty swallowing or breathing.  Vital signs are stable.  Treating with clindamycin (patient is allergic to penicillin).  Tylenol or ibuprofen as needed for discomfort.  Strict ED precautions discussed.  Instructed patient to follow-up with her dentist as scheduled next week.  Dental resource guide provided and information on dental abscess.  Patient agrees to plan of care.   Final Clinical Impressions(s) / UC Diagnoses   Final diagnoses:  Facial swelling  Dental infection     Discharge Instructions      Take the antibiotic  as prescribed.    A dental resource guide is attached.  Please call to make  an appointment with a dentist as soon as possible.    Go to the emergency department if you have acute worsening symptoms.       ED Prescriptions     Medication Sig Dispense Auth. Provider   clindamycin (CLEOCIN) 300 MG capsule Take 1 capsule (300 mg total) by mouth 3 (three) times daily. 21 capsule Mickie Bail, NP      I have reviewed the PDMP during this encounter.   Mickie Bail, NP 11/20/22 1136

## 2023-01-04 ENCOUNTER — Ambulatory Visit
Admission: EM | Admit: 2023-01-04 | Discharge: 2023-01-04 | Disposition: A | Discharge status: Home / Self Care | Payer: BC Managed Care – PPO | Attending: Emergency Medicine | Admitting: Emergency Medicine

## 2023-01-04 DIAGNOSIS — J069 Acute upper respiratory infection, unspecified: Secondary | ICD-10-CM

## 2023-01-04 MED ORDER — BENZONATATE 100 MG PO CAPS: 100.0000 mg | ORAL_CAPSULE | Freq: Three times a day (TID) | ORAL | 0 refills | Status: DC

## 2023-01-04 MED ORDER — AZELASTINE HCL 0.1 % NA SOLN: 2.0000 | Freq: Two times a day (BID) | NASAL | 0 refills | Status: AC

## 2023-01-04 NOTE — ED Provider Notes (Signed)
Roderic Palau    CSN: [id number] Arrival date & time: 01/04/23  1903      History   Chief Complaint Chief Complaint  Patient presents with   Cough    HPI Leah Gates is a 42 y.o. female. Pt presents to uc with co of cough, congestion, ha body aches and fatigue for 4 days. Pt has attempted nyquill, cold and flu otc, and musinex and tea with honey. Pt reports cought keeps her up at night and she feels like she is losing her voice.  Tested herself for COVID at home and it was negative.  Has congestion the left side and occasionally has green discharge but denies sinus pressure.  Does smoke but has cut back and is trying to quit.  Took Tylenol and Mucinex today last was sometime before lunch.    Cough   Past Medical History:  Diagnosis Date   Asthma    Shingles    Smoker     Patient Active Problem List   Diagnosis Date Noted   Tobacco use 04/02/2020    Past Surgical History:  Procedure Laterality Date   DILATION AND CURETTAGE OF UTERUS      OB History   No obstetric history on file.      Home Medications    Prior to Admission medications   Medication Sig Start Date End Date Taking? Authorizing Provider  azelastine (ASTELIN) 0.1 % nasal spray Place 2 sprays into both nostrils 2 (two) times daily. Use in each nostril as directed 01/04/23  Yes Josalin Carneiro, Dionne Bucy, NP  benzonatate (TESSALON) 100 MG capsule Take 1 capsule (100 mg total) by mouth every 8 (eight) hours. 01/04/23  Yes Carvel Getting, NP  albuterol (VENTOLIN HFA) 108 (90 Base) MCG/ACT inhaler Inhale 1-2 puffs into the lungs every 6 (six) hours as needed for wheezing or shortness of breath. 10/07/21   Sharion Balloon, NP  clindamycin (CLEOCIN) 300 MG capsule Take 1 capsule (300 mg total) by mouth 3 (three) times daily. 11/20/22   Sharion Balloon, NP  etonogestrel (NEXPLANON) 68 MG IMPL implant 1 each by Subdermal route once.    [provider]  oxyCODONE-acetaminophen (PERCOCET) 5-325 MG tablet  Take 1 tablet by mouth every 8 (eight) hours as needed. Patient not taking: Reported on 04/01/2020 10/10/19   Earleen Newport, MD    Family History Family History  Problem Relation Age of Onset   Hyperlipidemia Mother    Breast cancer Mother    Diabetes Maternal Grandfather    Stroke Maternal Grandfather    Cancer Maternal Grandfather        bladder   Heart attack Maternal Grandfather    Stroke Paternal Grandmother     Social History Social History   Tobacco Use   Smoking status: Every Day    Packs/day: 1.00    Years: 27.00    Total pack years: 27.00    Types: Cigarettes   Smokeless tobacco: Never  Vaping Use   Vaping Use: Never used  Substance Use Topics   Alcohol use: No   Drug use: No     Allergies   Penicillins   Review of Systems Review of Systems   Physical Exam Triage Vital Signs ED Triage Vitals  Enc Vitals Group     BP 01/04/23 1923 118/77     Pulse Rate 01/04/23 1923 95     Resp 01/04/23 1923 16     Temp 01/04/23 1923 99.2 F (37.3 C)  Temp Source 01/04/23 1923 Oral     SpO2 01/04/23 1923 95 %     Weight --      Height --      Head Circumference --      Peak Flow --      Pain Score 01/04/23 1929 3     Pain Loc --      Pain Edu? --      Excl. in GC? --    No data found.  Updated Vital Signs BP 118/77 (BP Location: Right Arm)   Pulse 95   Temp 99.2 F (37.3 C) (Oral)   Resp 16   SpO2 95%   Visual Acuity Right Eye Distance:   Left Eye Distance:   Bilateral Distance:    Right Eye Near:   Left Eye Near:    Bilateral Near:     Physical Exam Constitutional:      General: She is not in acute distress.    Appearance: Normal appearance. She is ill-appearing.  HENT:     Right Ear: Tympanic membrane, ear canal and external ear normal.     Left Ear: Tympanic membrane and ear canal normal.     Nose: Congestion present.     Right Sinus: No maxillary sinus tenderness or frontal sinus tenderness.     Left Sinus: No maxillary  sinus tenderness.     Mouth/Throat:     Mouth: Mucous membranes are moist.     Pharynx: Oropharynx is clear. No oropharyngeal exudate or posterior oropharyngeal erythema.  Cardiovascular:     Rate and Rhythm: Regular rhythm.  Pulmonary:     Effort: Pulmonary effort is normal.     Breath sounds: Normal breath sounds.  Lymphadenopathy:     Head:     Right side of head: No submandibular adenopathy.     Left side of head: No submandibular adenopathy.  Neurological:     Mental Status: She is alert.      UC Treatments / Results  Labs (all labs ordered are listed, but only abnormal results are displayed) Labs Reviewed - No data to display  EKG   Radiology No results found.  Procedures Procedures (including critical care time)  Medications Ordered in UC Medications - No data to display  Initial Impression / Assessment and Plan / UC Course  I have reviewed the triage vital signs and the nursing notes.  Pertinent labs & imaging results that were available during my care of the patient were reviewed by me and considered in my medical decision making (see chart for details).    Sx quality and onset not consistent with flu. Likely other viral uri. Recommended supportive care measures, reviewed course of illness  Final Clinical Impressions(s) / UC Diagnoses   Final diagnoses:  URI with cough and congestion     Discharge Instructions      Use saline nasal spray several times a day to help your congestion drain.  Continue taking Mucinex.  Drink lots of liquids.  Do anything that will help make your nose run instead of it draining down the back your throat.  The symptoms include taking steamy showers, using saline nasal spray, Vicks vapor rub, drinking hot tea, eating hot soup, etc.   ED Prescriptions     Medication Sig Dispense Auth. Provider   benzonatate (TESSALON) 100 MG capsule Take 1 capsule (100 mg total) by mouth every 8 (eight) hours. 21 capsule Cathlyn Parsons, NP    azelastine (ASTELIN) 0.1 % nasal spray  Place 2 sprays into both nostrils 2 (two) times daily. Use in each nostril as directed 30 mL Sie Formisano, Dionne Bucy, NP      PDMP not reviewed this encounter.   Carvel Getting, NP 01/12/23 1112

## 2023-01-04 NOTE — ED Triage Notes (Signed)
Pt presents to uc with co of cough, congestion, ha body aches and fatigue for 3 days. Pt has attempted nyquill, cold and flu otc, and musinex and tea with honey. Pt reports cought keeps her up at night and she feels like she is losing her voice.

## 2023-01-04 NOTE — Discharge Instructions (Signed)
Use saline nasal spray several times a day to help your congestion drain.  Continue taking Mucinex.  Drink lots of liquids.  Do anything that will help make your nose run instead of it draining down the back your throat.  The symptoms include taking steamy showers, using saline nasal spray, Vicks vapor rub, drinking hot tea, eating hot soup, etc.

## 2023-04-21 ENCOUNTER — Ambulatory Visit: Payer: Self-pay

## 2023-04-21 ENCOUNTER — Encounter: Payer: Self-pay | Admitting: Family Medicine

## 2023-04-21 ENCOUNTER — Ambulatory Visit (LOCAL_COMMUNITY_HEALTH_CENTER): Payer: Self-pay | Admitting: Family Medicine

## 2023-04-21 VITALS — BP 106/70 | HR 68 | Ht 64.0 in | Wt 133.4 lb

## 2023-04-21 DIAGNOSIS — Z309 Encounter for contraceptive management, unspecified: Secondary | ICD-10-CM

## 2023-04-21 DIAGNOSIS — Z975 Presence of (intrauterine) contraceptive device: Secondary | ICD-10-CM

## 2023-04-21 DIAGNOSIS — Z113 Encounter for screening for infections with a predominantly sexual mode of transmission: Secondary | ICD-10-CM

## 2023-04-21 DIAGNOSIS — Z01419 Encounter for gynecological examination (general) (routine) without abnormal findings: Secondary | ICD-10-CM

## 2023-04-21 LAB — WET PREP FOR TRICH, YEAST, CLUE
Trichomonas Exam: NEGATIVE
Yeast Exam: NEGATIVE

## 2023-04-21 NOTE — Progress Notes (Signed)
Pt is here for PE, Pap smear and STD screening.  Wet mount results reviewed, no treatment required per SO.  FP packet given.  Valoria Tamburri M Abshir Paolini, RN  

## 2023-04-21 NOTE — Progress Notes (Signed)
[facility] DEPARTMENT [facility] [address] Main Number: [phone]  Family Planning Visit- Repeat Yearly Visit  Subjective:  Leah Gates is a 42 y.o. [phone]  being seen today for an annual wellness visit and to discuss contraception options.   The patient is currently using Hormonal Implant for pregnancy prevention. Patient does not want a pregnancy in the next year.    report they are looking for a method that provides High efficacy at preventing pregnancy   Patient has the following medical problems: has Tobacco use on their problem list.  Chief Complaint  Patient presents with   Annual Exam    PE and Pap smear    Patient reports to clinic for PE and pap smear   Patient denies concerns about self    See flowsheet for other program required questions.   Body mass index is 22.9 kg/m. - Patient is eligible for diabetes screening based on BMI> 25 and age >35?  no HA1C ordered? not applicable  Patient reports 1 of partners in last year. Desires STI screening?  Yes   Has patient been screened once for HCV in the past?  No  No results found for: "HCVAB"  Does the patient have current of drug use, have a partner with drug use, and/or has been incarcerated since last result? No  If yes-- Screen for HCV through [facility]   Does the patient meet criteria for HBV testing? No  Criteria:  -Household, sexual or needle sharing contact with HBV -History of drug use -HIV positive -Those with known Hep C   Health Maintenance Due  Topic Date Due   COVID-19 Vaccine (1) Never done   Hepatitis C Screening  Never done   DTaP/Tdap/Td (1 - Tdap) Never done   PAP-Cervical Cytology Screening  03/11/2022   PAP SMEAR-Modifier  03/11/2022    Review of Systems  Constitutional:  Negative for weight loss.  Eyes:  Negative for blurred vision.  Respiratory:  Negative for cough and shortness of breath.   Cardiovascular:  Negative for  claudication.  Gastrointestinal:  Negative for nausea.  Genitourinary:  Negative for dysuria and frequency.  Skin:  Negative for rash.  Neurological:  Negative for headaches.  Endo/Heme/Allergies:  Does not bruise/bleed easily.    The following portions of the patient's history were reviewed and updated as appropriate: allergies, current medications, past family history, past medical history, past social history, past surgical history and problem list. Problem list updated.  Objective:   Vitals:   04/21/23 1316  BP: 106/70  Pulse: 68  Weight: 133 lb 6.4 oz (60.5 kg)  Height: 5\' 4"  (1.626 m)    Physical Exam Vitals and nursing note reviewed.  Constitutional:      Appearance: Normal appearance.  HENT:     Head: Normocephalic and atraumatic.     Mouth/Throat:     Mouth: Mucous membranes are moist.     Pharynx: Oropharynx is clear. No oropharyngeal exudate or posterior oropharyngeal erythema.  Pulmonary:     Effort: Pulmonary effort is normal.  Chest:  Breasts:    Tanner Score is 5.     Right: Tenderness present. No mass, nipple discharge or skin change.     Left: Tenderness present. No mass, nipple discharge or skin change.  Abdominal:     General: Abdomen is flat.     Palpations: There is no mass.     Tenderness: There is no abdominal tenderness. There is no rebound.  Genitourinary:    General: Normal vulva.     Exam position: Lithotomy position.     Pubic Area: No rash or pubic lice.      Labia:        Right: No rash or lesion.        Left: No rash or lesion.      Vagina: Normal. No vaginal discharge, erythema, bleeding or lesions.     Cervix: No cervical motion tenderness, discharge, friability, lesion or erythema.     Uterus: Normal.      Adnexa: Right adnexa normal and left adnexa normal.     Rectum: Normal.     Comments: pH = 4 Lymphadenopathy:     Head:     Right side of head: No preauricular or posterior auricular adenopathy.     Left side of head: No  preauricular or posterior auricular adenopathy.     Cervical: No cervical adenopathy.     Upper Body:     Right upper body: No supraclavicular, axillary or epitrochlear adenopathy.     Left upper body: No supraclavicular, axillary or epitrochlear adenopathy.     Lower Body: No right inguinal adenopathy. No left inguinal adenopathy.  Skin:    General: Skin is warm and dry.     Findings: No rash.  Neurological:     Mental Status: She is alert and oriented to person, place, and time.     Assessment and Plan:  Leah Gates is a 42 y.o. female [phone] presenting to the [facility] Department for an yearly wellness and contraception visit  1. Well woman exam with routine gynecological exam -CBE today (normal) C/o bilateral breast tenderness- no nodules or masses palpated -pt drinks a lot of caffeine- counseled to cut back on caffeine and take vitamin E supplement -has insurance, counseled she will need referral from a PCP for mammogram  - IGP, Aptima HPV  2. Screening for venereal disease Declined blood work today  - Chlamydia/Gonorrhea [facility] - WET PREP FOR TRICH, YEAST, CLUE  3. Nexplanon in place Contraception counseling: Reviewed options based on patient desire and reproductive life plan. Patient is interested in Hormonal Implant. This was not provided to the patient today. Implant in place  Risks, benefits, and typical effectiveness rates were reviewed.  Questions were answered.  Written information was also given to the patient to review.    The patient will follow up in  1 years for surveillance.  The patient was told to call with any further questions, or with any concerns about this method of contraception.  Emphasized use of condoms 100% of the time for STI prevention.  Patient was assessed for need for ECP. Not indicated, implant in place  Nexplanon good for 1 more year  No follow-ups on file.  No future appointments.  Lenice Llamas, [place]

## 2023-04-28 LAB — IGP, APTIMA HPV
HPV Aptima: NEGATIVE
PAP Smear Comment: 0

## 2023-10-28 ENCOUNTER — Encounter: Payer: Self-pay | Admitting: *Deleted

## 2023-10-28 ENCOUNTER — Ambulatory Visit
Admission: EM | Admit: 2023-10-28 | Discharge: 2023-10-28 | Disposition: A | Discharge status: Home / Self Care | Payer: BLUE CROSS/BLUE SHIELD | Attending: Emergency Medicine | Admitting: Emergency Medicine

## 2023-10-28 DIAGNOSIS — J209 Acute bronchitis, unspecified: Secondary | ICD-10-CM

## 2023-10-28 DIAGNOSIS — J45901 Unspecified asthma with (acute) exacerbation: Secondary | ICD-10-CM

## 2023-10-28 MED ORDER — PREDNISONE 10 MG PO TABS: 40.0000 mg | ORAL_TABLET | Freq: Every day | ORAL | 0 refills | Status: AC

## 2023-10-28 MED ORDER — AZITHROMYCIN 250 MG PO TABS: 250.0000 mg | ORAL_TABLET | Freq: Every day | ORAL | 0 refills | Status: DC

## 2023-10-28 MED ORDER — ALBUTEROL SULFATE HFA 108 (90 BASE) MCG/ACT IN AERS: 1.0000 | INHALATION_SPRAY | Freq: Four times a day (QID) | RESPIRATORY_TRACT | 0 refills | Status: DC | PRN

## 2023-10-28 NOTE — ED Triage Notes (Signed)
Patient states 6 days of cough/congestion headache that is worsening.  OTC cough syrups, sinus med not helping

## 2023-10-28 NOTE — ED Provider Notes (Signed)
[name]    CSN: [id number] Arrival date & time: 10/28/23  1531      History   Chief Complaint Chief Complaint  Patient presents with   Cough   Nasal Congestion   Headache    HPI CAITRIN PUFAHL is a 42 y.o. female.  Patient presents with 6-day history of congestion, cough, headache.  Treating with OTC cold and sinus medication.  No fever, chest pain, shortness of breath, or other symptoms.  She does not have an albuterol inhaler to use at this time.  Her medical history includes asthma.  Current everyday smoker.  The history is provided by the patient and medical records.    Past Medical History:  Diagnosis Date   Asthma    Shingles    Smoker     Patient Active Problem List   Diagnosis Date Noted   Tobacco use 04/02/2020    Past Surgical History:  Procedure Laterality Date   DILATION AND CURETTAGE OF UTERUS      OB History     Gravida  7   Para  3   Term      Preterm      AB  4   Living  3      SAB      IAB      Ectopic      Multiple      Live Births               Home Medications    Prior to Admission medications   Medication Sig Start Date End Date Taking? Authorizing Provider  albuterol (VENTOLIN HFA) 108 (90 Base) MCG/ACT inhaler Inhale 1-2 puffs into the lungs every 6 (six) hours as needed. 10/28/23  Yes Mickie Bail, NP  azithromycin (ZITHROMAX) 250 MG tablet Take 1 tablet (250 mg total) by mouth daily. Take first 2 tablets together, then 1 every day until finished. 10/28/23  Yes Mickie Bail, NP  predniSONE (DELTASONE) 10 MG tablet Take 4 tablets (40 mg total) by mouth daily for 5 days. 10/28/23 11/02/23 Yes Mickie Bail, NP  azelastine (ASTELIN) 0.1 % nasal spray Place 2 sprays into both nostrils 2 (two) times daily. Use in each nostril as directed Patient not taking: Reported on 04/21/2023 01/04/23   Cathlyn Parsons, NP  benzonatate (TESSALON) 100 MG capsule Take 1 capsule (100 mg total) by mouth every 8  (eight) hours. Patient not taking: Reported on 04/21/2023 01/04/23   Cathlyn Parsons, NP  clindamycin (CLEOCIN) 300 MG capsule Take 1 capsule (300 mg total) by mouth 3 (three) times daily. Patient not taking: Reported on 04/21/2023 11/20/22   Mickie Bail, NP  etonogestrel (NEXPLANON) 68 MG IMPL implant 1 each by Subdermal route once.    [provider]  oxyCODONE-acetaminophen (PERCOCET) 5-325 MG tablet Take 1 tablet by mouth every 8 (eight) hours as needed. Patient not taking: Reported on 04/01/2020 10/10/19   Emily Filbert, MD    Family History Family History  Problem Relation Age of Onset   Hyperlipidemia Mother    Breast cancer Mother    Breast cancer Maternal Grandmother        and Rectal cancer   Diabetes Maternal Grandfather    Stroke Maternal Grandfather    Cancer Maternal Grandfather        bladder   Heart attack Maternal Grandfather    Stroke Paternal Grandmother     Social History Social History   Tobacco Use  Smoking status: Every Day    Current packs/day: 1.00    Average packs/day: 1 pack/day for 27.0 years (27.0 ttl pk-yrs)    Types: Cigarettes   Smokeless tobacco: Never  Vaping Use   Vaping status: Never Used  Substance Use Topics   Alcohol use: Yes    Alcohol/week: 1.0 standard drink of alcohol    Types: 1 Glasses of wine per week    Comment: Rarely   Drug use: Not Currently    Types: Marijuana, Cocaine    Comment: used cocaine and marijuana in 20"s     Allergies   Penicillins   Review of Systems Review of Systems  Constitutional:  Negative for chills and fever.  HENT:  Positive for congestion. Negative for ear pain and sore throat.   Respiratory:  Positive for cough. Negative for shortness of breath.   Cardiovascular:  Negative for chest pain and palpitations.  Neurological:  Positive for headaches.     Physical Exam Triage Vital Signs ED Triage Vitals  Encounter Vitals Group     BP 10/28/23 1630 111/74     Systolic BP  Percentile --      Diastolic BP Percentile --      Pulse Rate 10/28/23 1630 75     Resp 10/28/23 1630 18     Temp 10/28/23 1630 98.5 F (36.9 C)     Temp src --      SpO2 10/28/23 1630 96 %     Weight 10/28/23 1636 122 lb (55.3 kg)     Height 10/28/23 1636 5\' 4"  (1.626 m)     Head Circumference --      Peak Flow --      Pain Score 10/28/23 1635 6     Pain Loc --      Pain Education --      Exclude from Growth Chart --    No data found.  Updated Vital Signs BP 111/74   Pulse 75   Temp 98.5 F (36.9 C)   Resp 18   Ht 5\' 4"  (1.626 m)   Wt 122 lb (55.3 kg)   SpO2 96%   BMI 20.94 kg/m   Visual Acuity Right Eye Distance:   Left Eye Distance:   Bilateral Distance:    Right Eye Near:   Left Eye Near:    Bilateral Near:     Physical Exam Constitutional:      General: She is not in acute distress. HENT:     Right Ear: Tympanic membrane normal.     Left Ear: Tympanic membrane normal.     Nose: Congestion present.     Mouth/Throat:     Mouth: Mucous membranes are moist.     Pharynx: Oropharynx is clear.     Comments: PND Cardiovascular:     Rate and Rhythm: Normal rate and regular rhythm.     Heart sounds: Normal heart sounds.  Pulmonary:     Effort: Pulmonary effort is normal. No respiratory distress.     Breath sounds: Normal breath sounds.     Comments: Frequent cough Neurological:     Mental Status: She is alert.      UC Treatments / Results  Labs (all labs ordered are listed, but only abnormal results are displayed) Labs Reviewed - No data to display  EKG   Radiology No results found.  Procedures Procedures (including critical care time)  Medications Ordered in UC Medications - No data to display  Initial Impression / Assessment and  Plan / UC Course  I have reviewed the triage vital signs and the nursing notes.  Pertinent labs & imaging results that were available during my care of the patient were reviewed by me and considered in my  medical decision making (see chart for details).    Asthma exacerbation, acute bronchitis.  O2 sat 96% on room air.  Patient has been symptomatic for a week.  Treating with albuterol inhaler, prednisone, Zithromax.  Instructed her to follow-up with her PCP if she is not improving.  ED precautions given.  Education provided on asthma and bronchitis.  She agrees to plan of care.  Final Clinical Impressions(s) / UC Diagnoses   Final diagnoses:  Asthma with acute exacerbation, unspecified asthma severity, unspecified whether persistent  Acute bronchitis, unspecified organism     Discharge Instructions      Use the albuterol inhaler as directed.  Take the prednisone and Zithromax as directed.    Follow-up with your primary care provider if your symptoms are not improving.      ED Prescriptions     Medication Sig Dispense Auth. Provider   albuterol (VENTOLIN HFA) 108 (90 Base) MCG/ACT inhaler Inhale 1-2 puffs into the lungs every 6 (six) hours as needed. 18 g Mickie Bail, NP   predniSONE (DELTASONE) 10 MG tablet Take 4 tablets (40 mg total) by mouth daily for 5 days. 20 tablet Mickie Bail, NP   azithromycin (ZITHROMAX) 250 MG tablet Take 1 tablet (250 mg total) by mouth daily. Take first 2 tablets together, then 1 every day until finished. 6 tablet Mickie Bail, NP      PDMP not reviewed this encounter.   Mickie Bail, NP 10/28/23 [phone]

## 2023-10-28 NOTE — Discharge Instructions (Addendum)
Use the albuterol inhaler as directed.  Take the prednisone and Zithromax as directed.  Follow up with your primary care provider if your symptoms are not improving.    

## 2023-10-30 ENCOUNTER — Telehealth: Payer: Self-pay

## 2023-10-30 MED ORDER — PROMETHAZINE-DM 6.25-15 MG/5ML PO SYRP: 5.0000 mL | ORAL_SOLUTION | Freq: Four times a day (QID) | ORAL | 0 refills | Status: DC | PRN

## 2023-10-30 MED ORDER — BENZONATATE 100 MG PO CAPS: 100.0000 mg | ORAL_CAPSULE | Freq: Three times a day (TID) | ORAL | 0 refills | Status: DC

## 2023-10-30 NOTE — Telephone Encounter (Signed)
Patient called clinic asking if she can get a prescription for her cough. States she was seen 10/28/23. Consulted with White, NP who states she will send her promethazine and tessalon prescriptions. Pharmacy reviewed with patient and she verbalized understanding. Instructed her to call if she has any additional questions.

## 2024-01-12 ENCOUNTER — Ambulatory Visit
Admission: EM | Admit: 2024-01-12 | Discharge: 2024-01-12 | Disposition: A | Discharge status: Home / Self Care | Payer: Self-pay | Attending: Emergency Medicine | Admitting: Emergency Medicine

## 2024-01-12 DIAGNOSIS — J4521 Mild intermittent asthma with (acute) exacerbation: Secondary | ICD-10-CM

## 2024-01-12 DIAGNOSIS — J209 Acute bronchitis, unspecified: Secondary | ICD-10-CM

## 2024-01-12 MED ORDER — PROMETHAZINE-DM 6.25-15 MG/5ML PO SYRP: 5.0000 mL | ORAL_SOLUTION | Freq: Four times a day (QID) | ORAL | 0 refills | Status: DC | PRN

## 2024-01-12 MED ORDER — PREDNISONE 10 MG (21) PO TBPK: ORAL_TABLET | Freq: Every day | ORAL | 0 refills | Status: DC

## 2024-01-12 MED ORDER — AZITHROMYCIN 250 MG PO TABS: 250.0000 mg | ORAL_TABLET | Freq: Every day | ORAL | 0 refills | Status: DC

## 2024-01-12 NOTE — ED Provider Notes (Signed)
 [name]    CSN: [id number] Arrival date & time: 01/12/24  1241      History   Chief Complaint Chief Complaint  Patient presents with   Cough    HPI Leah Gates is a 43 y.o. female.   Patient presents for evaluation of nasal congestion, runny Rea, intermittent headaches, sore throat, centralized chest pressure exacerbated by wheezing present for 5 days.  Cough interfering with sleep, having shortness of breath after episodes, has caused episodes of vomiting with last occurrence 1 day ago.  Poor appetite but tolerating food and liquids.  Has attempted use of Tylenol  cold and flu, NyQuil, Motrin , DayQuil.  Denies fever.  History of asthma.  Tobacco use.  Past Medical History:  Diagnosis Date   Asthma    Shingles    Smoker     Patient Active Problem List   Diagnosis Date Noted   Tobacco use 04/02/2020    Past Surgical History:  Procedure Laterality Date   DILATION AND CURETTAGE OF UTERUS      OB History     Gravida  7   Para  3   Term      Preterm      AB  4   Living  3      SAB      IAB      Ectopic      Multiple      Live Births               Home Medications    Prior to Admission medications   Medication Sig Start Date End Date Taking? Authorizing Provider  azithromycin  (ZITHROMAX ) 250 MG tablet Take 1 tablet (250 mg total) by mouth daily. Take first 2 tablets together, then 1 every day until finished. 01/12/24  Yes Kateryn Marasigan R, NP  predniSONE  (STERAPRED UNI-PAK 21 TAB) 10 MG (21) TBPK tablet Take by mouth daily. Take 6 tabs by mouth daily  for 1 days, then 5 tabs for 1 days, then 4 tabs for 1 days, then 3 tabs for 1 days, 2 tabs for 1 days, then 1 tab by mouth daily for 1 days 01/12/24  Yes Geovanni Rahming R, NP  promethazine -dextromethorphan (PROMETHAZINE -DM) 6.25-15 MG/5ML syrup Take 5 mLs by mouth 4 (four) times daily as needed for cough. 01/12/24  Yes Mickie Kozikowski R, NP  albuterol  (VENTOLIN  HFA) 108 (90 Base) MCG/ACT  inhaler Inhale 1-2 puffs into the lungs every 6 (six) hours as needed. 10/28/23   Corlis Burnard DEL, NP  azelastine  (ASTELIN ) 0.1 % nasal spray Place 2 sprays into both nostrils 2 (two) times daily. Use in each nostril as directed Patient not taking: Reported on 04/21/2023 01/04/23   Richad Jon CHRISTELLA, NP  benzonatate  (TESSALON ) 100 MG capsule Take 1 capsule (100 mg total) by mouth every 8 (eight) hours. Patient not taking: Reported on 04/21/2023 01/04/23   Richad Jon CHRISTELLA, NP  benzonatate  (TESSALON ) 100 MG capsule Take 1 capsule (100 mg total) by mouth every 8 (eight) hours. 10/30/23   Shyloh Derosa, Shelba SAUNDERS, NP  clindamycin  (CLEOCIN ) 300 MG capsule Take 1 capsule (300 mg total) by mouth 3 (three) times daily. Patient not taking: Reported on 04/21/2023 11/20/22   Corlis Burnard DEL, NP  etonogestrel  (NEXPLANON ) 68 MG IMPL implant 1 each by Subdermal route once.    [provider]  oxyCODONE -acetaminophen  (PERCOCET) 5-325 MG tablet Take 1 tablet by mouth every 8 (eight) hours as needed. Patient not taking: Reported on 04/01/2020 10/10/19  Trudy Dorn BRAVO, MD    Family History Family History  Problem Relation Age of Onset   Hyperlipidemia Mother    Breast cancer Mother    Breast cancer Maternal Grandmother        and Rectal cancer   Diabetes Maternal Grandfather    Stroke Maternal Grandfather    Cancer Maternal Grandfather        bladder   Heart attack Maternal Grandfather    Stroke Paternal Grandmother     Social History Social History   Tobacco Use   Smoking status: Every Day    Current packs/day: 1.00    Average packs/day: 1 pack/day for 27.0 years (27.0 ttl pk-yrs)    Types: Cigarettes   Smokeless tobacco: Never  Vaping Use   Vaping status: Never Used  Substance Use Topics   Alcohol use: Yes    Alcohol/week: 1.0 standard drink of alcohol    Types: 1 Glasses of wine per week    Comment: Rarely   Drug use: Not Currently    Types: Marijuana, Cocaine    Comment: used cocaine and  marijuana in 20s     Allergies   Penicillins   Review of Systems Review of Systems  Respiratory:  Positive for cough.      Physical Exam Triage Vital Signs ED Triage Vitals  Encounter Vitals Group     BP 01/12/24 1417 98/65     Systolic BP Percentile --      Diastolic BP Percentile --      Pulse Rate 01/12/24 1417 68     Resp 01/12/24 1417 18     Temp 01/12/24 1417 97.8 F (36.6 C)     Temp Source 01/12/24 1417 Temporal     SpO2 01/12/24 1417 92 %     Weight --      Height --      Head Circumference --      Peak Flow --      Pain Score 01/12/24 1424 0     Pain Loc --      Pain Education --      Exclude from Growth Chart --    No data found.  Updated Vital Signs BP 98/65 (BP Location: Left Arm)   Pulse 68   Temp 97.8 F (36.6 C) (Temporal)   Resp 18   SpO2 92%   Visual Acuity Right Eye Distance:   Left Eye Distance:   Bilateral Distance:    Right Eye Near:   Left Eye Near:    Bilateral Near:     Physical Exam Constitutional:      Appearance: She is ill-appearing.  HENT:     Head: Normocephalic.     Right Ear: Tympanic membrane, ear canal and external ear normal.     Left Ear: Tympanic membrane, ear canal and external ear normal.     Nose: Congestion present. No rhinorrhea.     Mouth/Throat:     Mouth: Mucous membranes are moist.     Pharynx: Oropharynx is clear. No posterior oropharyngeal erythema.  Eyes:     Extraocular Movements: Extraocular movements intact.  Cardiovascular:     Rate and Rhythm: Normal rate and regular rhythm.     Pulses: Normal pulses.     Heart sounds: Normal heart sounds.  Pulmonary:     Effort: Pulmonary effort is normal.     Breath sounds: Normal breath sounds.  Musculoskeletal:     Cervical back: Normal range of motion and neck supple.  Skin:    General: Skin is warm and dry.  Neurological:     Mental Status: She is alert and oriented to person, place, and time. Mental status is at baseline.      UC  Treatments / Results  Labs (all labs ordered are listed, but only abnormal results are displayed) Labs Reviewed - No data to display  EKG   Radiology No results found.  Procedures Procedures (including critical care time)  Medications Ordered in UC Medications - No data to display  Initial Impression / Assessment and Plan / UC Course  I have reviewed the triage vital signs and the nursing notes.  Pertinent labs & imaging results that were available during my care of the patient were reviewed by me and considered in my medical decision making (see chart for details).  Acute Bronchitis, mild intermittent asthma with acute exacerbation  Patient is in no signs of distress nor toxic appearing.  Vital signs are stable.  Low suspicion for pneumonia, pneumothorax or bronchitis and therefore will defer imaging.  Home COVID test negative, additional viral testing deferred due to timeline of illness.  Prescribed azithromycin , prednisone  and Promethazine  DM, declined IM steroid injection.May use additional over-the-counter medications as needed for supportive care.  May follow-up with urgent care as needed if symptoms persist or worsen.  Final Clinical Impressions(s) / UC Diagnoses   Final diagnoses:  Acute bronchitis, unspecified organism  Mild intermittent asthma with acute exacerbation     Discharge Instructions      Icing to prevent symptoms from worsening to more serious lung condition such as pneumonia  Begin prednisone  every morning as directed to help and relax the airway should settle shortness of breath, chest tightness and wheezing  You may use cough syrup every 6 hours as needed for comfort    You can take Tylenol  and/or Ibuprofen  as needed for fever reduction and pain relief.   For cough: honey 1/2 to 1 teaspoon (you can dilute the honey in water or another fluid).  You can also use guaifenesin and dextromethorphan for cough. You can use a humidifier for chest congestion  and cough.  If you don't have a humidifier, you can sit in the bathroom with the hot shower running.      For sore throat: try warm salt water gargles, cepacol lozenges, throat spray, warm tea or water with lemon/honey, popsicles or ice, or OTC cold relief medicine for throat discomfort.   For congestion: take a daily anti-histamine like Zyrtec , Claritin, and a oral decongestant, such as pseudoephedrine.  You can also use Flonase  1-2 sprays in each nostril daily.   It is important to stay hydrated: drink plenty of fluids (water, gatorade/powerade/pedialyte, juices, or teas) to keep your throat moisturized and help further relieve irritation/discomfort.    ED Prescriptions     Medication Sig Dispense Auth. Provider   azithromycin  (ZITHROMAX ) 250 MG tablet Take 1 tablet (250 mg total) by mouth daily. Take first 2 tablets together, then 1 every day until finished. 6 tablet Marnae Madani R, NP   predniSONE  (STERAPRED UNI-PAK 21 TAB) 10 MG (21) TBPK tablet Take by mouth daily. Take 6 tabs by mouth daily  for 1 days, then 5 tabs for 1 days, then 4 tabs for 1 days, then 3 tabs for 1 days, 2 tabs for 1 days, then 1 tab by mouth daily for 1 days 21 tablet Georganna Maxson R, NP   promethazine -dextromethorphan (PROMETHAZINE -DM) 6.25-15 MG/5ML syrup Take 5 mLs by mouth 4 (four)  times daily as needed for cough. 118 mL Briseida Gittings, Shelba SAUNDERS, NP      PDMP not reviewed this encounter.   Teresa Shelba SAUNDERS, NP 01/12/24 1455

## 2024-01-12 NOTE — ED Triage Notes (Signed)
Provider triaged.  

## 2024-01-12 NOTE — Discharge Instructions (Signed)
 Icing to prevent symptoms from worsening to more serious lung condition such as pneumonia  Begin prednisone  every morning as directed to help and relax the airway should settle shortness of breath, chest tightness and wheezing  You may use cough syrup every 6 hours as needed for comfort    You can take Tylenol  and/or Ibuprofen  as needed for fever reduction and pain relief.   For cough: honey 1/2 to 1 teaspoon (you can dilute the honey in water or another fluid).  You can also use guaifenesin and dextromethorphan for cough. You can use a humidifier for chest congestion and cough.  If you don't have a humidifier, you can sit in the bathroom with the hot shower running.      For sore throat: try warm salt water gargles, cepacol lozenges, throat spray, warm tea or water with lemon/honey, popsicles or ice, or OTC cold relief medicine for throat discomfort.   For congestion: take a daily anti-histamine like Zyrtec , Claritin, and a oral decongestant, such as pseudoephedrine.  You can also use Flonase  1-2 sprays in each nostril daily.   It is important to stay hydrated: drink plenty of fluids (water, gatorade/powerade/pedialyte, juices, or teas) to keep your throat moisturized and help further relieve irritation/discomfort.

## 2024-02-05 HISTORY — PX: OTHER SURGICAL HISTORY: SHX169

## 2024-04-21 ENCOUNTER — Ambulatory Visit: Payer: Self-pay

## 2024-05-04 ENCOUNTER — Encounter: Payer: Self-pay | Admitting: Physician Assistant

## 2024-05-04 ENCOUNTER — Ambulatory Visit: Payer: Self-pay

## 2024-05-04 VITALS — BP 90/63 | HR 78 | Ht 64.0 in | Wt 128.2 lb

## 2024-05-04 DIAGNOSIS — Z30017 Encounter for initial prescription of implantable subdermal contraceptive: Secondary | ICD-10-CM

## 2024-05-04 DIAGNOSIS — Z3009 Encounter for other general counseling and advice on contraception: Secondary | ICD-10-CM

## 2024-05-04 DIAGNOSIS — F17200 Nicotine dependence, unspecified, uncomplicated: Secondary | ICD-10-CM

## 2024-05-04 DIAGNOSIS — Z01419 Encounter for gynecological examination (general) (routine) without abnormal findings: Secondary | ICD-10-CM

## 2024-05-04 DIAGNOSIS — Z3046 Encounter for surveillance of implantable subdermal contraceptive: Secondary | ICD-10-CM

## 2024-05-04 LAB — WET PREP FOR TRICH, YEAST, CLUE
Clue Cell Exam: NEGATIVE
Trichomonas Exam: NEGATIVE
Yeast Exam: NEGATIVE

## 2024-05-04 MED ORDER — ETONOGESTREL 68 MG ~~LOC~~ IMPL
68.0000 mg | DRUG_IMPLANT | Freq: Once | SUBCUTANEOUS | Status: AC
  Administered 2024-05-04: 68 mg via SUBCUTANEOUS

## 2024-05-04 NOTE — Progress Notes (Signed)
 [employer] HE[facility] DEPARTMENT [facility] [address] [place] [address] Main phone: [phone]  Family Planning Visit - Initial Visit  Subjective:  Leah Gates is a 43 y.o.  [id number]   being seen today for an initial annual visit and to discuss reproductive life planning.  The patient is currently using hormonal implant for pregnancy prevention. Patient does not want a pregnancy in the next year.   Patient reports they are looking for a method with the following characteristics:  High efficacy at preventing pregnancy  Patient has the following medical conditions: Patient Active Problem List   Diagnosis Date Noted   Tobacco use 04/02/2020    Chief Complaint  Patient presents with   Annual Exam   Contraception    HPI Patient reports rare spotting with Nexplanon . Had Trich in Jan 2025, treated and wants test of cure today.  Patient denies other concerns. Desires Nexplanon  replacement.   Review of Systems  Constitutional: Negative.   HENT: Negative.    Eyes: Negative.   Respiratory: Negative.    Cardiovascular: Negative.   Gastrointestinal: Negative.   Genitourinary: Negative.   Musculoskeletal: Negative.   Skin: Negative.   Neurological: Negative.   Endo/Heme/Allergies: Negative.   Psychiatric/Behavioral: Negative.      Diabetes screening This patient is 43 y.o. with a BMI of Body mass index is 22.01 kg/m.Aaron Aas  Is patient eligible for diabetes screening (age >35 and BMI >25)?  not applicable  Was Hgb A1c ordered? not applicable  STI screening Patient reports 1 of partners in last year.  Does this patient desire STI screening?  Yes  Hepatitis C screening Has patient been screened once for HCV in the past?  Yes Neg 03/2023  No results found for: "HCVAB"  Does the patient meet criteria for HCV testing? No  (If yes-- Screen for HCV through [facility]) Criteria:  Since the last HCV result, does the patient have any  of the following? - Current drug use - Have a partner with drug use - Has been incarcerated  Hepatitis B screening Does the patient meet criteria for HBV testing? No Criteria:  -Household, sexual or needle sharing contact with HBV -History of drug use -HIV positive -Those with known Hep C  Cervical Cancer Screening  Result Date Procedure Results Follow-ups  04/21/2023 IGP, Aptima HPV DIAGNOSIS:: Comment Specimen adequacy:: Comment Clinician Provided ICD10: Comment Performed by:: Comment PAP Smear Comment: . Note:: Comment Test Methodology: Comment HPV Aptima: Negative   03/11/2017 Cytology - PAP Pap Smear: NILM HPV: HRHPV - Transformation Zone: Present HPV testing in 5 years: due 03/11/2022 Pap in 5 years: due 03/11/2022  03/09/2017 HM PAP SMEAR HM Pap smear: Negative, HPV negative   12/15/2011 Cytology - PAP Pap Smear: ASC-US  Transformation Zone: Present   12/24/2009 Cytology - PAP Pap Smear: NILM   10/23/2008 Cytology - PAP Pap Smear: ASC-US    12/12/2007 Cytology - PAP Pap Smear: LSIL Colposcopy  12/10/2006 Cytology - PAP Pap Smear: NILM     Health Maintenance Due  Topic Date Due   Hepatitis C Screening  Never done   DTaP/Tdap/Td (1 - Tdap) Never done   Pneumococcal Vaccine 39-61 Years old (1 of 2 - PCV) Never done   COVID-19 Vaccine (1 - 2024-25 season) Never done    The following portions of the patient's history were reviewed and updated as appropriate: allergies, current medications, past family history, past medical history, past social history, past surgical history and problem list.  Problem list updated.  See flowsheet for further details and programmatic requirements Hyperlink available at the top of the signed note in blue.  Flow sheet content below:  Pregnancy Intention Screening Does the patient want to become pregnant in the next year?: No Does the patient's partner want to become pregnant in the next year?: No Would the patient like to discuss contraceptive options  today?: Yes Results Follow up Password: [email or web address] Contraception History Past methods of contraception used by patient:: Hormonal Implant, Contraceptive Pill, Hormonal Injection, Vaginal Ring Adverse effects associated with Contraceptive Pill: none, smoker Adverse effects associated with Hormonal Injection: bleeding Adverse effects associated with Hormonal Implant: none Adverse effects associated with Vaginal Ring: disliked Sexual History What age did you start your period?: 11 How often do you have your period?: none with implant Date of last sex?: 05/02/24 Has the patient had unprotected sex within the last 5 days?: Yes Do you have sex with men, women, both men and women?: Men only In the past 2 months how many partners have you had sex with?: 1 In the past 12 months, how many partners have you had sex with?: 1 Is it possible that any of your sex partners in the past 12 months had sex with someone else whild they were still in a sexual relationship with you?: Yes What ways do you have sex?: Vaginal Do you or your partner use condoms and/or dental dams every time you have vaginal, oral or anal sex?: No Do you douche?: No Date of last HIV test?: 12/21/23 Have you ever had an STD?: Yes Have any of your partners had an STD?: Yes Partner Previous STD?: Trichomonas Date?:  (early 2025) Have you or your partner ever shot up drugs?: No Have any of your partners used drugs in the past?: No Have you or your partners exchanged money or drugs for sex?: No Counseling All Patients: LARCS discussed, Use specific methods of contraceoptive and identify adverse effects (R), Methods of contraception including abstinence reviewed by tiered approach Education: Make informed decision about family planning, Provided preconception counseling, Reduce risk of transmission and protection from STD's and HIV, Understand BMI >25 or >18.5 is a health risk (weight management educational materials to be provided to  client requests), Promoted daily consumption of MVI with folic acid if capable of conceiving., Review immunization history, inform client of recommended vaccines per CDC's ACIP Guidelines and refer to Immunization clinic, Results of physical assessment and labs (if performed), How to discontinue the method selected and information on back up method used, How to use the method selected and information on back up method used, Teach back method completed, How to use the method consistently and correctly, PCP list given to patient, Is patient pregnant?, When to return for follow up (planned return schedule), Warning signs for rare but serious adverse events and what to do if they experience a warning sign (including emergency 24 hour number, where to seek emergency service outside of hours of operation) Contraception Wrap Up Current Method: Hormonal Implant End Method: Hormonal Implant Contraception Counseling Provided: Yes How was the end contraceptive method provided?: Provided on site  Objective:   Vitals:   05/04/24 1004  BP: 90/63  Pulse: 78  Weight: 128 lb 3.2 oz (58.2 kg)  Height: 5\' 4"  (1.626 m)    Physical Exam Nursing note reviewed. Exam conducted with a chaperone present Susanna Epley, CNA).  Constitutional:      Appearance: Normal appearance.  HENT:     Head: Normocephalic and  atraumatic.  Pulmonary:     Effort: Pulmonary effort is normal.  Abdominal:     Palpations: Abdomen is soft.  Genitourinary:    General: Normal vulva.     Exam position: Lithotomy position.     Pubic Area: No rash.      Tanner stage (genital): 5.     Labia:        Right: No lesion.        Left: No lesion.   Musculoskeletal:        General: Normal range of motion.  Skin:    General: Skin is warm and dry.  Neurological:     General: No focal deficit present.     Mental Status: She is alert.  Psychiatric:        Mood and Affect: Mood normal.        Behavior: Behavior normal.     Assessment and  Plan:  ALESIA OSHIELDS is a 43 y.o. female presenting to the [facility] Department for an initial annual wellness/contraceptive visit  Contraception counseling:  Reviewed options based on patient desire and reproductive life plan. Patient is interested in Hormonal Implant. This was provided to the patient today.  Risks, benefits, and typical effectiveness rates were reviewed.  Questions were answered.  Written information was also given to the patient to review.    The patient will follow up in  1 years for surveillance.  The patient was told to call with any further questions, or with any concerns about this method of contraception.  Emphasized use of condoms 100% of the time for STI prevention.  Emergency Contraception Precautions (ECP): Patient assessed for need of ECP. She is not a candidate based on LARC in place and unexpired .  Educated on ECP and reviewed options.  Patient desires no method - patient politely declines any emergency contraception.    1. Family planning services (Primary) Enc to f/u with PCP.  2. Well woman exam with routine gynecological exam Enc pt to get bilateral screening mammogram - has not yet had this done. FH pos. - Chlamydia/Gonorrhea [facility] - WET PREP FOR TRICH, YEAST, CLUE  3. Nexplanon  removal Procedure:  Nexplanon  Removal Patient identified, informed consent performed, consent signed.   Appropriate time out taken. Nexplanon  site identified in the patient's left arm.  Area prepped in usual sterile fashon. 3 ml of 1% lidocaine  with Epinephrine was used to anesthetize the area at the distal end of the implant and along implant site. A small stab incision was made right beside the implant on the distal portion.  The Nexplanon  rod was grasped using hemostats and removed without difficulty.  There was minimal blood loss. There were no complications.  Steri-strips were applied over the small incision.  A pressure bandage was applied to reduce  any bruising.  The patient tolerated the procedure well and was given post procedure instructions.     4. Nexplanon  insertion Procedure:  Nexplanon  Insertion  Patient identified, informed consent performed, consent signed.   Patient does understand that irregular bleeding is a very common side effect of this medication. She was advised to have backup contraception after placement. Patient was determined to meet WHO criteria for not being pregnant. Appropriate time out taken.  The insertion site was identified 8-10 cm (3-4 inches) from the medial epicondyle of the humerus and 3-5 cm (1.25-2 inches) posterior to (below) the sulcus (groove) between the biceps and triceps muscles of the patient's left arm and marked.  Patient was prepped with alcohol swab and then injected with 2 ml of 1% lidocaine .  Arm was prepped with chlorhexidene, Nexplanon  removed from packaging,  Device confirmed in needle, then inserted full length of needle and withdrawn per handbook instructions. Nexplanon  was able to palpated in the patient's arm; patient palpated the insert herself. There was minimal blood loss.  Patient insertion site covered with guaze and a pressure bandage to reduce any bruising.  The patient tolerated the procedure well and was given post procedure instructions.   5. Tobacco smoking. Enc tobacco cessation. [place] Quitline info given.  Return in about 1 year (around 05/04/2025) for Annual well-woman exam.  No future appointments.  Deroy Noah, PA-C

## 2024-05-04 NOTE — Progress Notes (Signed)
 Pt is here for family planning visit and nexplanon  insert.  Family planning education card and nexplanon  card reviewed and given to pt.  Wet prep results reviewed, no treatment required per standing orders. Condoms declined. Caren Channel, RN

## 2024-10-26 ENCOUNTER — Ambulatory Visit
Admission: EM | Admit: 2024-10-26 | Discharge: 2024-10-26 | Disposition: A | Discharge status: Home / Self Care | Payer: Self-pay | Attending: Emergency Medicine | Admitting: Emergency Medicine

## 2024-10-26 DIAGNOSIS — J069 Acute upper respiratory infection, unspecified: Secondary | ICD-10-CM

## 2024-10-26 DIAGNOSIS — J45901 Unspecified asthma with (acute) exacerbation: Secondary | ICD-10-CM

## 2024-10-26 DIAGNOSIS — F17200 Nicotine dependence, unspecified, uncomplicated: Secondary | ICD-10-CM

## 2024-10-26 MED ORDER — PREDNISONE 10 MG (21) PO TBPK: ORAL_TABLET | Freq: Every day | ORAL | 0 refills | Status: AC

## 2024-10-26 MED ORDER — ALBUTEROL SULFATE HFA 108 (90 BASE) MCG/ACT IN AERS: 1.0000 | INHALATION_SPRAY | Freq: Four times a day (QID) | RESPIRATORY_TRACT | 0 refills | Status: AC | PRN

## 2024-10-26 MED ORDER — AZITHROMYCIN 250 MG PO TABS: 250.0000 mg | ORAL_TABLET | Freq: Every day | ORAL | 0 refills | Status: AC

## 2024-10-26 MED ORDER — PROMETHAZINE-DM 6.25-15 MG/5ML PO SYRP: 5.0000 mL | ORAL_SOLUTION | Freq: Four times a day (QID) | ORAL | 0 refills | Status: AC | PRN

## 2024-10-26 NOTE — Discharge Instructions (Addendum)
 Use the albuterol  inhaler as directed.  Take the prednisone  and Zithromax  as directed.    Take the promethazine  as directed.  Do not drive, operate machinery, drink alcohol, or perform dangerous activities while taking this medication as it may cause drowsiness.  Follow up with your primary care provider tomorrow.  Go to the emergency department if you have worsening symptoms.

## 2024-10-26 NOTE — ED Triage Notes (Addendum)
 Patient to Urgent Care with complaints of  nasal congestion/ drainage/ dry cough. Waking up during the night d/t coughing.  Symptoms x1 week. Reports granddaughter was sick w/ same symptoms. Using otc cold and flu.

## 2024-10-26 NOTE — ED Provider Notes (Signed)
 Leah Gates    CSN: [id number] Arrival date & time: 10/26/24  0957      History   Chief Complaint Chief Complaint  Patient presents with   Cough    HPI Leah Gates is a 43 y.o. female.  Patient presents with 1 week history of congestion, postnasal drip, nonproductive cough.  Her cough is persistent and is waking her up at night.  She reports history of asthma and states she does not currently have an albuterol  inhaler.  She denies fever or shortness of breath.  Her symptoms have not been improving with OTC cold and flu medications.  The history is provided by the patient and medical records.    Past Medical History:  Diagnosis Date   Asthma    Shingles    Smoker     Patient Active Problem List   Diagnosis Date Noted   Tobacco use 04/02/2020    Past Surgical History:  Procedure Laterality Date   DILATION AND CURETTAGE OF UTERUS     teeth removal  02/2024    OB History     Gravida  7   Para  3   Term      Preterm      AB  4   Living  3      SAB      IAB      Ectopic      Multiple      Live Births               Home Medications    Prior to Admission medications   Medication Sig Start Date End Date Taking? Authorizing Provider  albuterol  (VENTOLIN  HFA) 108 (90 Base) MCG/ACT inhaler Inhale 1-2 puffs into the lungs every 6 (six) hours as needed. 10/26/24  Yes Corlis Burnard DEL, NP  azithromycin  (ZITHROMAX ) 250 MG tablet Take 1 tablet (250 mg total) by mouth daily. Take first 2 tablets together, then 1 every day until finished. 10/26/24  Yes Corlis Burnard DEL, NP  predniSONE  (STERAPRED UNI-PAK 21 TAB) 10 MG (21) TBPK tablet Take by mouth daily. As directed 10/26/24  Yes Corlis Burnard DEL, NP  promethazine -dextromethorphan (PROMETHAZINE -DM) 6.25-15 MG/5ML syrup Take 5 mLs by mouth 4 (four) times daily as needed. 10/26/24  Yes Corlis Burnard DEL, NP  azelastine  (ASTELIN ) 0.1 % nasal spray Place 2 sprays into both nostrils 2 (two) times daily. Use  in each nostril as directed Patient not taking: Reported on 04/21/2023 01/04/23   Richad Jon CHRISTELLA, NP  etonogestrel  (NEXPLANON ) 68 MG IMPL implant 1 each by Subdermal route once.    [provider]  oxyCODONE -acetaminophen  (PERCOCET) 5-325 MG tablet Take 1 tablet by mouth every 8 (eight) hours as needed. Patient not taking: Reported on 04/01/2020 10/10/19   Trudy Dorn BRAVO, MD    Family History Family History  Problem Relation Age of Onset   Hyperlipidemia Mother    Breast cancer Mother    Breast cancer Maternal Grandmother        and Rectal cancer   Diabetes Maternal Grandfather    Stroke Maternal Grandfather    Cancer Maternal Grandfather        bladder   Heart attack Maternal Grandfather    Stroke Paternal Grandmother     Social History Social History   Tobacco Use   Smoking status: Every Day    Current packs/day: 1.00    Average packs/day: 1 pack/day for 27.0 years (27.0 ttl pk-yrs)    Types: Cigarettes  Smokeless tobacco: Never  Vaping Use   Vaping status: Never Used  Substance Use Topics   Alcohol use: Not Currently    Comment: Rarely on occasions   Drug use: Not Currently    Types: Marijuana, Cocaine    Comment: used cocaine and marijuana in 20s     Allergies   Penicillins   Review of Systems Review of Systems  Constitutional:  Negative for chills and fever.  HENT:  Positive for congestion, postnasal drip and rhinorrhea. Negative for ear pain and sore throat.   Respiratory:  Positive for cough. Negative for shortness of breath.      Physical Exam Triage Vital Signs ED Triage Vitals [10/26/24 1015]  Encounter Vitals Group     BP      Girls Systolic BP Percentile      Girls Diastolic BP Percentile      Boys Systolic BP Percentile      Boys Diastolic BP Percentile      Pulse      Resp      Temp      Temp src      SpO2      Weight      Height      Head Circumference      Peak Flow      Pain Score 8     Pain Loc      Pain  Education      Exclude from Growth Chart    No data found.  Updated Vital Signs BP 112/80   Pulse 83   Temp 97.9 F (36.6 C)   Resp 18   SpO2 96%   Visual Acuity Right Eye Distance:   Left Eye Distance:   Bilateral Distance:    Right Eye Near:   Left Eye Near:    Bilateral Near:     Physical Exam Constitutional:      General: She is not in acute distress. HENT:     Right Ear: Tympanic membrane normal.     Left Ear: Tympanic membrane normal.     Nose: Nose normal.     Mouth/Throat:     Mouth: Mucous membranes are moist.     Pharynx: Oropharynx is clear.     Comments: PND Cardiovascular:     Rate and Rhythm: Normal rate and regular rhythm.     Heart sounds: Normal heart sounds.  Pulmonary:     Effort: Pulmonary effort is normal. No respiratory distress.     Breath sounds: Normal breath sounds.     Comments: Frequent nonproductive cough. Neurological:     Mental Status: She is alert.      UC Treatments / Results  Labs (all labs ordered are listed, but only abnormal results are displayed) Labs Reviewed - No data to display  EKG   Radiology No results found.  Procedures Procedures (including critical care time)  Medications Ordered in UC Medications - No data to display  Initial Impression / Assessment and Plan / UC Course  I have reviewed the triage vital signs and the nursing notes.  Pertinent labs & imaging results that were available during my care of the patient were reviewed by me and considered in my medical decision making (see chart for details).    Asthma exacerbation, acute upper respiratory infection, current everyday smoker.  Afebrile and vital signs are stable.  O2 sat 96% on room air.  Treating today with albuterol  inhaler, prednisone , Zithromax , Promethazine  DM.  Precautions for drowsiness with promethazine   discussed.  Education provided on asthma and URI.  Instructed patient to follow-up with her PCP.  ED precautions given.  She agrees  to plan of care.  Final Clinical Impressions(s) / UC Diagnoses   Final diagnoses:  Asthma with acute exacerbation, unspecified asthma severity, unspecified whether persistent  Acute upper respiratory infection  Current every day smoker     Discharge Instructions      Use the albuterol  inhaler as directed.  Take the prednisone  and Zithromax  as directed.    Take the promethazine  as directed.  Do not drive, operate machinery, drink alcohol, or perform dangerous activities while taking this medication as it may cause drowsiness.  Follow up with your primary care provider tomorrow.  Go to the emergency department if you have worsening symptoms.        ED Prescriptions     Medication Sig Dispense Auth. Provider   albuterol  (VENTOLIN  HFA) 108 (90 Base) MCG/ACT inhaler Inhale 1-2 puffs into the lungs every 6 (six) hours as needed. 18 g Corlis Burnard DEL, NP   azithromycin  (ZITHROMAX ) 250 MG tablet Take 1 tablet (250 mg total) by mouth daily. Take first 2 tablets together, then 1 every day until finished. 6 tablet Corlis Burnard DEL, NP   predniSONE  (STERAPRED UNI-PAK 21 TAB) 10 MG (21) TBPK tablet Take by mouth daily. As directed 21 tablet Corlis Burnard DEL, NP   promethazine -dextromethorphan (PROMETHAZINE -DM) 6.25-15 MG/5ML syrup Take 5 mLs by mouth 4 (four) times daily as needed. 118 mL Corlis Burnard DEL, NP      PDMP not reviewed this encounter.   Corlis Burnard DEL, NP 10/26/24 1051

## 2024-10-27 ENCOUNTER — Emergency Department: Admission: EM | Admit: 2024-10-27 | Discharge: 2024-10-27 | Discharge status: Against Medical Advice

## 2024-10-27 ENCOUNTER — Other Ambulatory Visit: Payer: Self-pay

## 2024-10-27 DIAGNOSIS — F17200 Nicotine dependence, unspecified, uncomplicated: Secondary | ICD-10-CM | POA: Insufficient documentation

## 2024-10-27 DIAGNOSIS — M79601 Pain in right arm: Secondary | ICD-10-CM | POA: Diagnosis present

## 2024-10-27 DIAGNOSIS — R202 Paresthesia of skin: Secondary | ICD-10-CM | POA: Diagnosis not present

## 2024-10-27 DIAGNOSIS — J45909 Unspecified asthma, uncomplicated: Secondary | ICD-10-CM | POA: Insufficient documentation

## 2024-10-27 NOTE — ED Triage Notes (Signed)
 Pt to ED via POV from home. Pt reports right arm pain x1 month with some tingling and heaviness. Pt reports talked to a nurse at her work and concerned for possible blood clot in arm. No blood thinners. Everyday smoker.

## 2024-10-27 NOTE — ED Provider Notes (Signed)
 [facility] Provider Note    Event Date/Time   First MD Initiated Contact with Patient 10/27/24 1325     (approximate)   History   Arm Pain   HPI  Leah Gates is a 43 y.o. female  with a past medical history of shingles, asthma presents to the emergency department with right upper arm pain with intermittent tingling and heaviness that has been present for roughly 1 month.  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  Patient denies swelling, headache, fall or injury, neck pain, chest pain, shortness of breath, vision changes.  Patient does not use oral OCPs; she does have a Nexplanon .  Does smoke cigarettes daily.  No history of PE or DVT.  No history of cancer.  No prolonged immobility or recent trauma.  Patient has tried ibuprofen , Tylenol  and  one of her family member's muscle relaxers without much relief.  She does not take any blood thinners.    Per chart review, she is currently on azithromycin , albuterol  and prednisone  and promethazine -dextromethorphan cough syrup for asthma exacerbation and acute URI after being seen by an ER provider yesterday.  Physical Exam   Triage Vital Signs: ED Triage Vitals  Encounter Vitals Group     BP 10/27/24 1229 113/74     Girls Systolic BP Percentile --      Girls Diastolic BP Percentile --      Boys Systolic BP Percentile --      Boys Diastolic BP Percentile --      Pulse Rate 10/27/24 1228 85     Resp 10/27/24 1228 18     Temp 10/27/24 1228 98 F (36.7 C)     Temp Source 10/27/24 1228 Oral     SpO2 10/27/24 1228 98 %     Weight --      Height --      Head Circumference --      Peak Flow --      Pain Score 10/27/24 1228 8     Pain Loc --      Pain Education --      Exclude from Growth Chart --     Most recent vital signs: Vitals:   10/27/24 1228  10/27/24 1229  BP:  113/74  Pulse: 85   Resp: 18   Temp: 98 F (36.7 C)   SpO2: 98%     General: Awake, in no acute distress. Appears stated age. Head: Normocephalic, atraumatic. Ears/Nose/Throat: TMs intact b/l. Nares patent, no nasal discharge. Oropharynx moist, no erythema or exudate. Dentition intact. Neck: Supple, no nuchal rigidity. Able to range neck in all motions. CV: Good peripheral perfusion. RRR, 85 bpm. Respiratory:Normal respiratory effort.  No respiratory distress. CTAB. GI: Soft, non-distended. MSK: Normal ROM and 5/5 strength in b/l upper extremities. No unilateral swelling of either arm. Skin:Warm, dry, intact. No rashes, lesions, or ecchymosis. No cyanosis or pallor. Neurological: A&Ox4 to person, place, time, and situation. Sensation intact. Strength symmetric. Reports subjective decreased sensation to C5 and C6 regions in right arm compared to left.  ED Results / Procedures / Treatments   Labs (all labs ordered are listed, but only abnormal results are displayed) Labs Reviewed - No data to display   EKG     RADIOLOGY    PROCEDURES:  Critical Care performed: No  Procedures   MEDICATIONS ORDERED IN ED: Medications - No data to display   IMPRESSION / MDM / ASSESSMENT AND PLAN / ED COURSE  I reviewed the triage vital signs and the nursing notes.                              Differential diagnosis includes, but is not limited to, arm paresthesia, cervical radiculopathy, muscle strain, brachial plexus injury, biceps tendonitis  Patient's presentation is most consistent with acute complicated illness / injury requiring diagnostic workup.  Patient is here with signs and symptoms as described above.  She is hemodynamically stable.  Has no overlying skin changes or swelling of either upper extremity on exam.  She does not have increased tenderness when I elevate her arm.  She reports subjective decreased sensation to C5 and C6 of the right arm  compared to the left on exam, otherwise she has good strength and range of motion in all of her upper extremities.  She had no prior fall or injury.  Her only risk factor for upper extremity DVT is history of cigarette smoking, but these symptoms have been present for a month now. I told her I would provide her with pain medications, order the upper extremity DVT and cervical spine imaging given she has not had any recent imaging of her neck. However, when I exited the room and began to place orders, she told me she had an emergency at work she needed to tend to and would come back to the emergency department later today for evaluation, and left the building. I do not believe there is an emergent condition going on in this patient at this time. Patient decided to elope from the ER.   FINAL CLINICAL IMPRESSION(S) / ED DIAGNOSES   Final diagnoses:  Right arm pain     Rx / DC Orders   ED Discharge Orders     None        Note:  This document was prepared using Dragon voice recognition software and may include unintentional dictation errors.     Sheron Salm, PA-C 10/27/24 1621    Nicholaus Rolland BRAVO, MD 10/27/24 [phone]
